# Patient Record
Sex: Female | Born: 1990 | Race: White | Hispanic: No | Smoking: Never smoker
Health system: Southern US, Community
[De-identification: ages and names within clinical notes are randomized; demographics above are authoritative.]

## PROBLEM LIST (undated history)

## (undated) DIAGNOSIS — D649 Anemia, unspecified: Secondary | ICD-10-CM

## (undated) DIAGNOSIS — Z8759 Personal history of other complications of pregnancy, childbirth and the puerperium: Secondary | ICD-10-CM

## (undated) HISTORY — PX: DILATION AND CURETTAGE OF UTERUS: SHX78

---
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
## 2012-09-06 ENCOUNTER — Other Ambulatory Visit: Payer: Self-pay | Admitting: Family Medicine

## 2012-09-06 DIAGNOSIS — N920 Excessive and frequent menstruation with regular cycle: Secondary | ICD-10-CM

## 2012-09-06 DIAGNOSIS — N898 Other specified noninflammatory disorders of vagina: Secondary | ICD-10-CM

## 2012-09-07 ENCOUNTER — Other Ambulatory Visit: Payer: Self-pay

## 2013-03-26 ENCOUNTER — Encounter (HOSPITAL_COMMUNITY): Payer: Self-pay | Admitting: *Deleted

## 2013-03-26 ENCOUNTER — Emergency Department (HOSPITAL_COMMUNITY): Payer: Self-pay

## 2013-03-26 ENCOUNTER — Emergency Department (HOSPITAL_COMMUNITY)
Admission: EM | Admit: 2013-03-26 | Discharge: 2013-03-27 | Disposition: A | Discharge status: Home / Self Care | Payer: Self-pay | Attending: Emergency Medicine | Admitting: Emergency Medicine

## 2013-03-26 ENCOUNTER — Emergency Department (HOSPITAL_COMMUNITY): Admission: EM | Admit: 2013-03-26 | Discharge: 2013-03-26 | Discharge status: Against Medical Advice | Payer: Self-pay

## 2013-03-26 DIAGNOSIS — O9989 Other specified diseases and conditions complicating pregnancy, childbirth and the puerperium: Secondary | ICD-10-CM | POA: Insufficient documentation

## 2013-03-26 DIAGNOSIS — Z3490 Encounter for supervision of normal pregnancy, unspecified, unspecified trimester: Secondary | ICD-10-CM

## 2013-03-26 DIAGNOSIS — O209 Hemorrhage in early pregnancy, unspecified: Secondary | ICD-10-CM | POA: Insufficient documentation

## 2013-03-26 DIAGNOSIS — R110 Nausea: Secondary | ICD-10-CM | POA: Insufficient documentation

## 2013-03-26 DIAGNOSIS — R630 Anorexia: Secondary | ICD-10-CM | POA: Insufficient documentation

## 2013-03-26 DIAGNOSIS — M549 Dorsalgia, unspecified: Secondary | ICD-10-CM | POA: Insufficient documentation

## 2013-03-26 DIAGNOSIS — M899 Disorder of bone, unspecified: Secondary | ICD-10-CM | POA: Insufficient documentation

## 2013-03-26 DIAGNOSIS — Z79899 Other long term (current) drug therapy: Secondary | ICD-10-CM | POA: Insufficient documentation

## 2013-03-26 DIAGNOSIS — R109 Unspecified abdominal pain: Secondary | ICD-10-CM | POA: Insufficient documentation

## 2013-03-26 DIAGNOSIS — M259 Joint disorder, unspecified: Secondary | ICD-10-CM | POA: Insufficient documentation

## 2013-03-26 LAB — POCT I-STAT, CHEM 8
Calcium, Ion: 1.23 mmol/L (ref 1.12–1.23)
HCT: 35 % — ABNORMAL LOW (ref 36.0–46.0)
Hemoglobin: 11.9 g/dL — ABNORMAL LOW (ref 12.0–15.0)
Sodium: 138 mEq/L (ref 135–145)
TCO2: 27 mmol/L (ref 0–100)

## 2013-03-26 LAB — URINE MICROSCOPIC-ADD ON

## 2013-03-26 LAB — URINALYSIS, ROUTINE W REFLEX MICROSCOPIC
Bilirubin Urine: NEGATIVE
Glucose, UA: NEGATIVE mg/dL
Ketones, ur: NEGATIVE mg/dL
Leukocytes, UA: NEGATIVE
Protein, ur: NEGATIVE mg/dL

## 2013-03-26 LAB — HCG, QUANTITATIVE, PREGNANCY: hCG, Beta Chain, Quant, S: 57377 m[IU]/mL — ABNORMAL HIGH (ref <5)

## 2013-03-26 MED ORDER — AZITHROMYCIN 1 G PO PACK
1.0000 g | PACK | Freq: Once | ORAL | Status: AC
  Administered 2013-03-26: 1 g via ORAL
  Filled 2013-03-26: qty 1

## 2013-03-26 MED ORDER — ONDANSETRON 4 MG PO TBDP
4.0000 mg | ORAL_TABLET | Freq: Once | ORAL | Status: AC
  Administered 2013-03-26: 4 mg via ORAL
  Filled 2013-03-26: qty 1

## 2013-03-26 MED ORDER — CEFTRIAXONE SODIUM 250 MG IJ SOLR
250.0000 mg | Freq: Once | INTRAMUSCULAR | Status: AC
  Administered 2013-03-26: 250 mg via INTRAMUSCULAR
  Filled 2013-03-26: qty 250

## 2013-03-26 NOTE — ED Notes (Signed)
PA at bedside.
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
## 2013-03-26 NOTE — ED Notes (Addendum)
Pt in s/p D and C on 3/15, states she has noted constant nausea since that time, also intermittent vaginal bleeding which they told her to expect for up to two weeks, pt states she has been unable to eat like normal due to nausea

## 2013-03-27 LAB — WET PREP, GENITAL
Trich, Wet Prep: NONE SEEN
Yeast Wet Prep HPF POC: NONE SEEN

## 2013-03-27 MED ORDER — ONDANSETRON HCL 4 MG PO TABS: 4.0000 mg | ORAL_TABLET | Freq: Four times a day (QID) | ORAL | Status: DC
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
## 2013-03-27 NOTE — ED Notes (Signed)
MD at bedside to discuss plan of care

## 2014-07-22 ENCOUNTER — Emergency Department (HOSPITAL_COMMUNITY)
Admission: EM | Admit: 2014-07-22 | Discharge: 2014-07-22 | Disposition: A | Discharge status: Home / Self Care | Payer: Self-pay | Attending: Emergency Medicine | Admitting: Emergency Medicine

## 2014-07-22 ENCOUNTER — Encounter (HOSPITAL_COMMUNITY): Payer: Self-pay | Admitting: Emergency Medicine

## 2014-07-22 ENCOUNTER — Emergency Department (HOSPITAL_COMMUNITY): Admission: EM | Admit: 2014-07-22 | Discharge: 2014-07-22 | Discharge status: Against Medical Advice | Payer: Self-pay

## 2014-07-22 DIAGNOSIS — Z3202 Encounter for pregnancy test, result negative: Secondary | ICD-10-CM | POA: Insufficient documentation

## 2014-07-22 DIAGNOSIS — J45909 Unspecified asthma, uncomplicated: Secondary | ICD-10-CM | POA: Insufficient documentation

## 2014-07-22 DIAGNOSIS — Z79899 Other long term (current) drug therapy: Secondary | ICD-10-CM | POA: Insufficient documentation

## 2014-07-22 DIAGNOSIS — R040 Epistaxis: Secondary | ICD-10-CM | POA: Insufficient documentation

## 2014-07-22 DIAGNOSIS — J029 Acute pharyngitis, unspecified: Secondary | ICD-10-CM | POA: Insufficient documentation

## 2014-07-22 DIAGNOSIS — F17200 Nicotine dependence, unspecified, uncomplicated: Secondary | ICD-10-CM | POA: Insufficient documentation

## 2014-07-22 LAB — POC URINE PREG, ED: Preg Test, Ur: NEGATIVE

## 2014-07-22 LAB — RAPID STREP SCREEN (MED CTR MEBANE ONLY): STREPTOCOCCUS, GROUP A SCREEN (DIRECT): NEGATIVE

## 2014-07-22 NOTE — ED Notes (Signed)
Pt arrived to the ED with a complaint of a nose bleed and a sore throat.  Pt states she has been battling a sore throat for six months.  Pt tonight had a nose bleed and then started to spit up blood.

## 2014-07-22 NOTE — Discharge Instructions (Signed)
Gargle warm salt water especially after meals. Followup closely with a local primary Dr. and ENT or worsening symptoms. You may have [place] Diverticulum. If you were given medicines take as directed.  If you are on coumadin or contraceptives realize their levels and effectiveness is altered by many different medicines.  If you have any reaction (rash, tongues swelling, other) to the medicines stop taking and see a physician.   Please follow up as directed and return to the ER or see a physician for new or worsening symptoms.  Thank you. Filed Vitals:   07/22/14 0108  BP: 132/86  Pulse: 75  Temp: 98.6 F (37 C)  TempSrc: Oral  Resp: 16  SpO2: 100%

## 2014-07-22 NOTE — ED Provider Notes (Signed)
CSN: [id number]     Arrival date & time 07/22/14  0058 History   First MD Initiated Contact with Patient 07/22/14 0401     Chief Complaint  Patient presents with  . Sore Throat  . Epistaxis     (Consider location/radiation/quality/duration/timing/severity/associated sxs/prior Treatment) HPI Comments: 23 year old female no significant medical history except asthma, mild smoker presents with recurrent sore throat for 6 months. Patient denies fevers, chills, weight loss, cancer history. Patient had mild nosebleed discussed risk of blood however that is resolved. Patient is on blood thinners and no injuries.  Patient is a 23 y.o. female presenting with pharyngitis and nosebleeds. The history is provided by the patient.  Sore Throat Pertinent negatives include no abdominal pain and no headaches.  Epistaxis Associated symptoms: sore throat   Associated symptoms: no congestion, no fever and no headaches     History reviewed. No pertinent past medical history. Past Surgical History  Procedure Laterality Date  . Dilation and curettage of uterus     History reviewed. No pertinent family history. History  Substance Use Topics  . Smoking status: Light Tobacco Smoker  . Smokeless tobacco: Not on file  . Alcohol Use: Yes   OB History   Grav Para Term Preterm Abortions TAB SAB Ect Mult Living                 Review of Systems  Constitutional: Negative for fever and chills.  HENT: Positive for nosebleeds and sore throat. Negative for congestion.   Gastrointestinal: Negative for vomiting and abdominal pain.  Musculoskeletal: Negative for back pain, neck pain and neck stiffness.  Skin: Negative for rash.  Neurological: Negative for headaches.      Allergies  Review of patient's allergies indicates no known allergies.  Home Medications   Prior to Admission medications   Medication Sig Start Date End Date Taking? Authorizing Provider  OVER THE COUNTER MEDICATION Take 1 each by  mouth daily.   Yes Historical Provider, MD   BP 132/86  Pulse 75  Temp(Src) 98.6 F (37 C) (Oral)  Resp 16  SpO2 100%  LMP 06/25/2014 Physical Exam  Nursing note and vitals reviewed. Constitutional: She appears well-developed and well-nourished. No distress.  HENT:  Head: Normocephalic.  Dried blood mild left nare septum anterior, no hematoma or active bleeding. No trismus, uvular deviation, unilateral posterior pharyngeal edema or submandibular swelling. Mild anterior cervical lymphadenopathy supple neck   Eyes: Conjunctivae are normal.  Neck: Normal range of motion. Neck supple.  Cardiovascular: Normal rate and regular rhythm.     ED Course  Procedures (including critical care time) Labs Review Labs Reviewed  RAPID STREP SCREEN  CULTURE, GROUP A STREP  POC URINE PREG, ED    Imaging Review No results found.   EKG Interpretation None      MDM   Final diagnoses:  Sore throat  Epistaxis  Patient with recurrent sore throat and mild epistaxis. Patient held his side and occasional smoking history.  Patient does complain of recurrent bad breath and feeling like food is caught in the back of her throat. Discussed other differential including diverticulum as possible diagnosis and close outpatient followup with her primary Dr. and ENT. No sign of significant ENT infection in ER. No epistaxis in ED>  Results and differential diagnosis were discussed with the patient/parent/guardian. Close follow up outpatient was discussed, comfortable with the plan.   Medications - No data to display  Filed Vitals:   07/22/14 0108  BP: 132/86  Pulse:  75  Temp: 98.6 F (37 C)  TempSrc: Oral  Resp: 16  SpO2: 100%         Enid Skeens, MD 07/22/14 [phone]

## 2014-07-24 LAB — CULTURE, GROUP A STREP

## 2015-12-26 ENCOUNTER — Other Ambulatory Visit: Payer: Self-pay | Admitting: Obstetrics

## 2015-12-26 DIAGNOSIS — R102 Pelvic and perineal pain: Secondary | ICD-10-CM

## 2016-01-01 ENCOUNTER — Other Ambulatory Visit: Payer: Self-pay

## 2016-01-07 ENCOUNTER — Ambulatory Visit
Admission: RE | Admit: 2016-01-07 | Discharge: 2016-01-07 | Disposition: A | Discharge status: Home / Self Care | Payer: PRIVATE HEALTH INSURANCE | Source: Ambulatory Visit | Attending: Obstetrics | Admitting: Obstetrics

## 2016-01-07 DIAGNOSIS — R102 Pelvic and perineal pain: Secondary | ICD-10-CM

## 2016-01-08 ENCOUNTER — Other Ambulatory Visit: Payer: Self-pay

## 2016-04-25 IMAGING — US US PELVIS COMPLETE
1 series · 13 of 25 positions shown · non-contrast
Comparison: OB ultrasound dated March 28, 2013

CLINICAL DATA: Intermittent episodes of abdominal and pelvic
discomfort. Onset of the last normal menstrual period was December 24, 2015

EXAM:
TRANSABDOMINAL AND TRANSVAGINAL ULTRASOUND OF PELVIS
TECHNIQUE: Both transabdominal and transvaginal ultrasound examinations of the
pelvis were performed. Transabdominal technique was performed for
global imaging of the pelvis including uterus, ovaries, adnexal
regions, and pelvic cul-de-sac. It was necessary to proceed with
endovaginal exam following the transabdominal exam to visualize the
endometrium and ovaries.

[Series 1: us pelvis complete · 0.27mm/px · 13 of 73 slices shown]
[im 1/73]
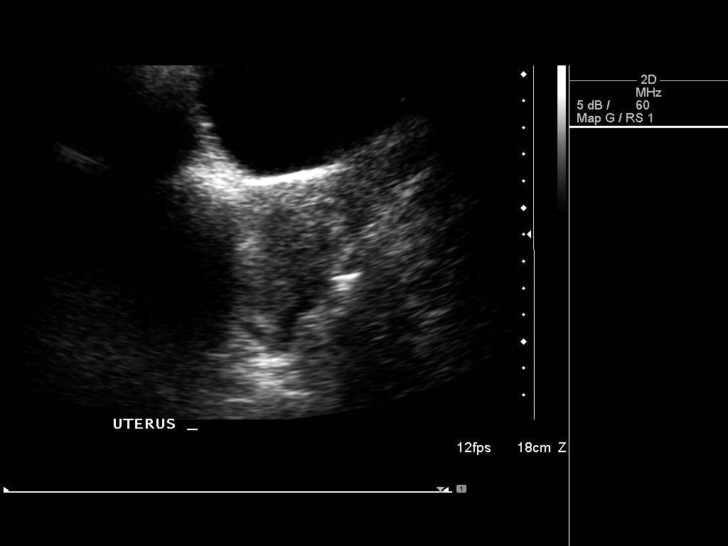
[im 7/73]
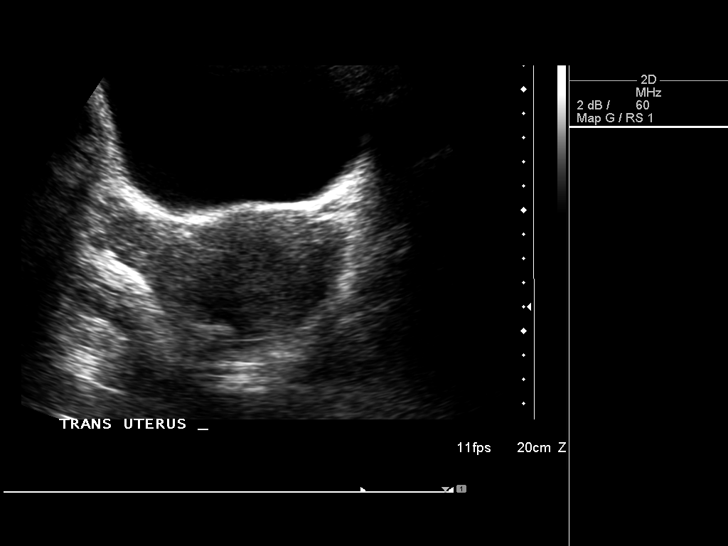
[im 13/73]
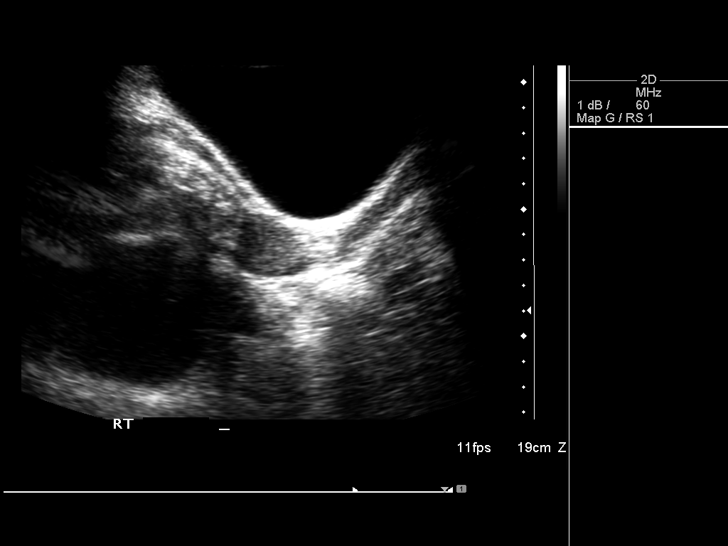
[im 19/73]
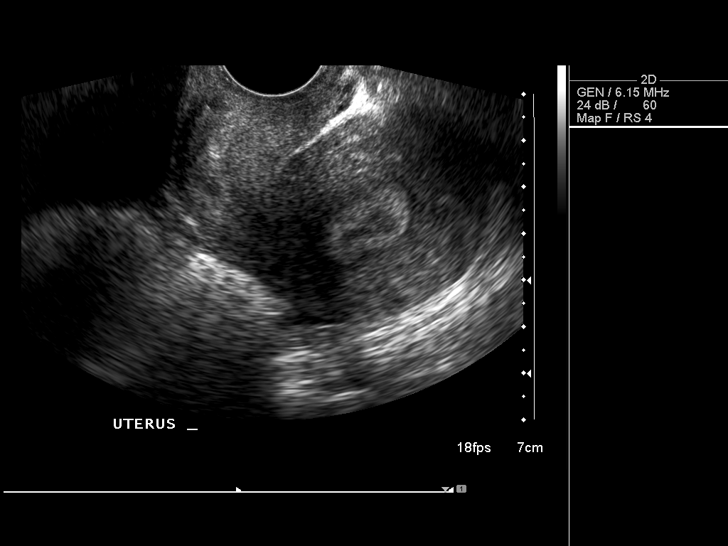
[im 25/73]
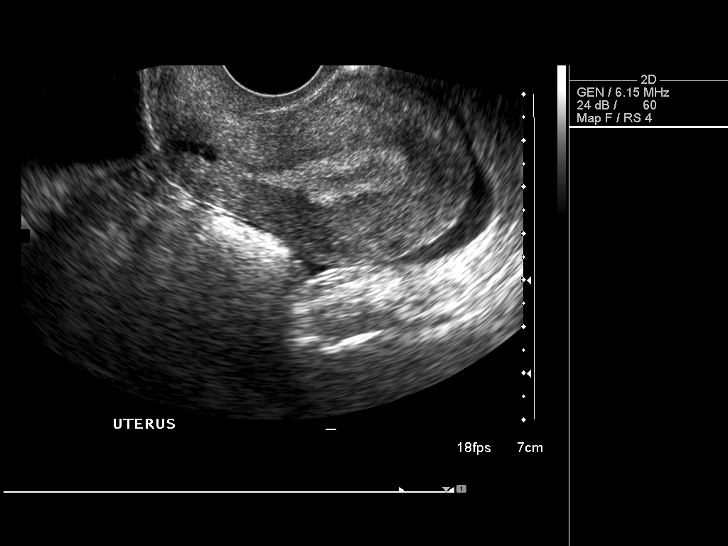
[im 31/73]
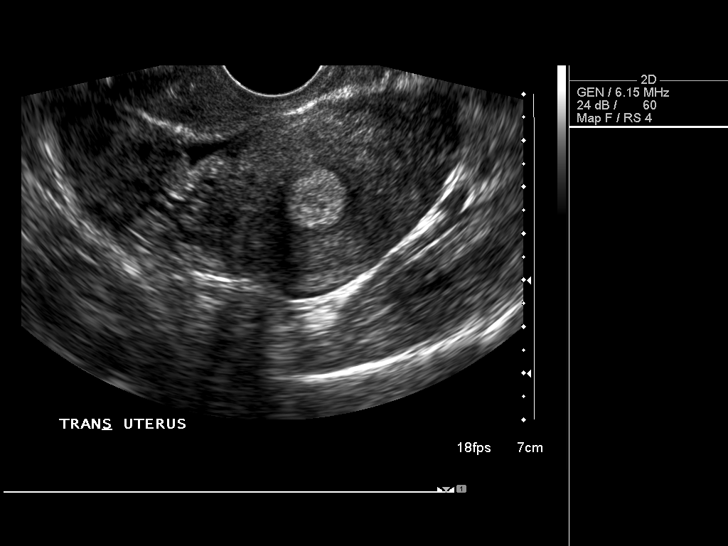
[im 37/73]
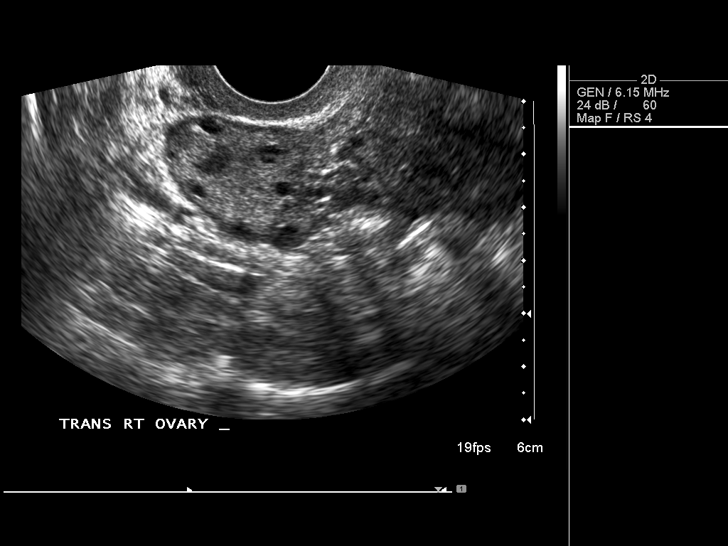
[im 43/73]
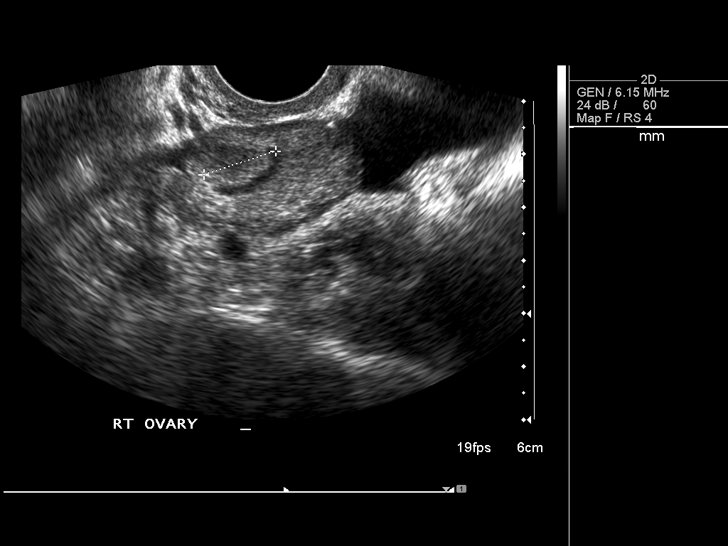
[im 49/73]
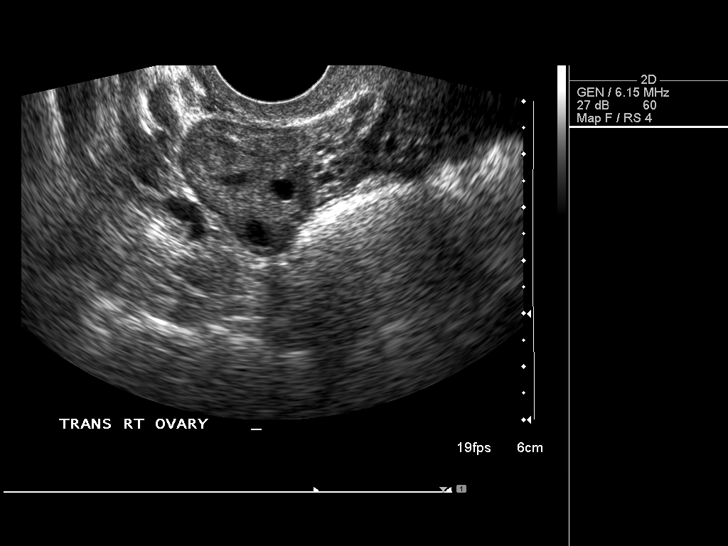
[im 55/73]
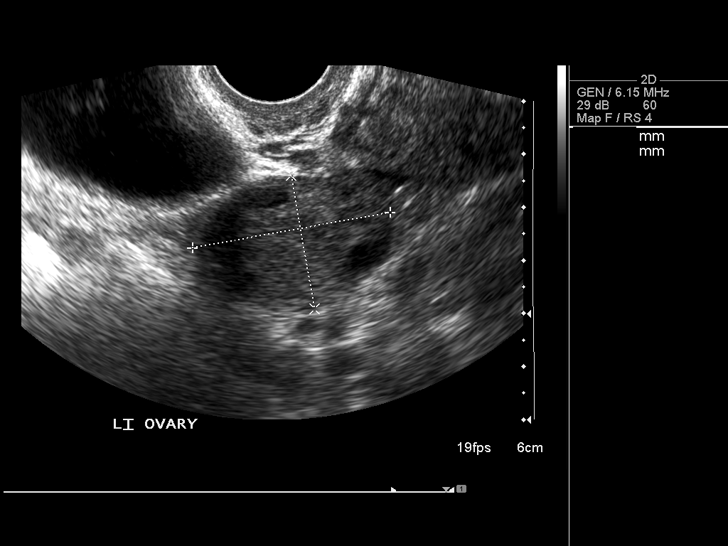
[im 61/73]
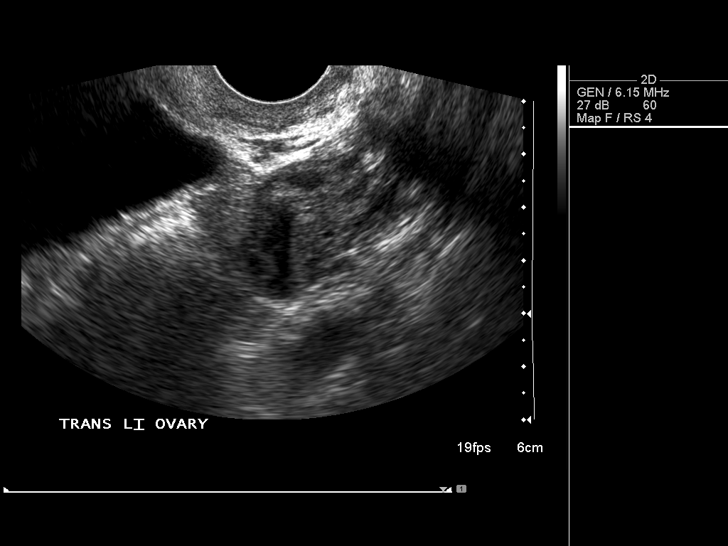
[im 67/73]
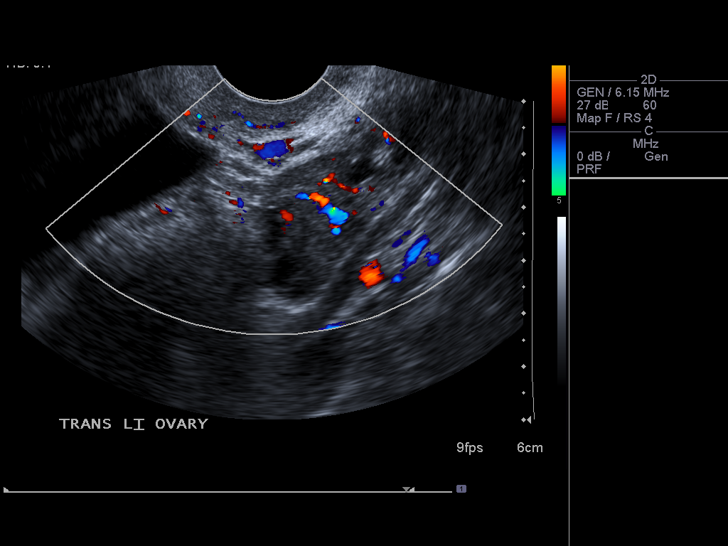
[im 73/73]
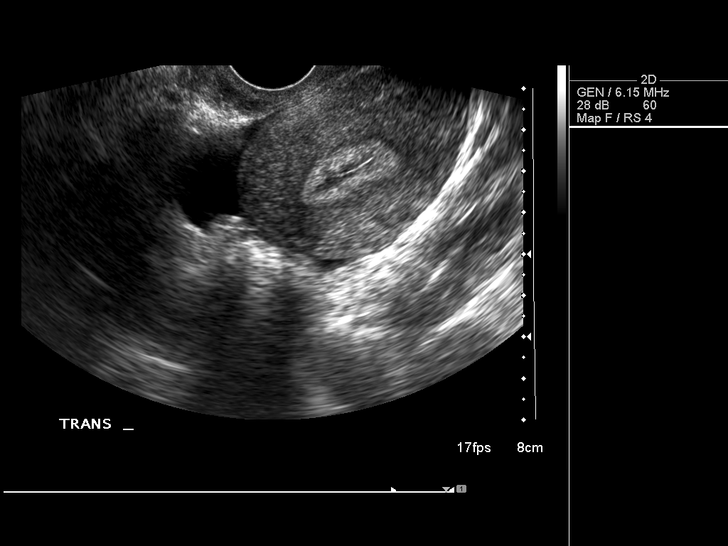

[13 of 25 positions shown; findings below may reference images not displayed]

FINDINGS: Uterus

Measurements: 8.7 x 4.3 x 7.2 cm.. The uterine echotexture is
normal. There are a few nabothian cysts.

Endometrium

Thickness: 9.8 mm.  No focal abnormality visualized.

Right ovary

Measurements: 3.6 x 2.4 x 2.7 cm. There are developing follicles. A
dominant 1.4 cm isoechoic top hypoechoic mixed echogenicity
structure may reflect a collapsing cyst.

Left ovary

Measurements: 3.8 x 2.6 x 1.9 cm. There are developing follicles in
the left ovary. There is a mixed echogenicity structure measuring up
to 2 cm in diameter similar to that on the right which likely
reflects a collapsing cyst.

Other findings

There is small amount of free pelvic fluid
IMPRESSION: 1. Normal appearance of the uterus and endometrium. A few nabothian
cysts are present.
2. Probable collapsing cysts bilaterally. The largest of these is on
the left and measures 2 cm in diameter.
3. Small amount of free pelvic fluid.
4. If the patient's intermittent abdominal and pelvic symptoms
persists, further evaluation with abdominal and pelvic CT scanning
may be useful to evaluate the bowel and other abdominal viscera.

## 2017-01-09 ENCOUNTER — Other Ambulatory Visit: Payer: Self-pay | Admitting: Family

## 2017-02-26 ENCOUNTER — Inpatient Hospital Stay (HOSPITAL_COMMUNITY)
Admission: AD | Admit: 2017-02-26 | Payer: PRIVATE HEALTH INSURANCE | Source: Ambulatory Visit | Admitting: Obstetrics and Gynecology

## 2018-08-28 ENCOUNTER — Emergency Department (HOSPITAL_COMMUNITY): Payer: Medicaid Other

## 2018-08-28 ENCOUNTER — Other Ambulatory Visit: Payer: Self-pay

## 2018-08-28 ENCOUNTER — Encounter (HOSPITAL_COMMUNITY): Payer: Self-pay | Admitting: Emergency Medicine

## 2018-08-28 ENCOUNTER — Emergency Department (HOSPITAL_COMMUNITY)
Admission: EM | Admit: 2018-08-28 | Discharge: 2018-08-28 | Disposition: A | Discharge status: Home / Self Care | Payer: Medicaid Other | Attending: Emergency Medicine | Admitting: Emergency Medicine

## 2018-08-28 DIAGNOSIS — Z79899 Other long term (current) drug therapy: Secondary | ICD-10-CM | POA: Diagnosis not present

## 2018-08-28 DIAGNOSIS — F17210 Nicotine dependence, cigarettes, uncomplicated: Secondary | ICD-10-CM | POA: Insufficient documentation

## 2018-08-28 DIAGNOSIS — N939 Abnormal uterine and vaginal bleeding, unspecified: Secondary | ICD-10-CM

## 2018-08-28 DIAGNOSIS — Z98891 History of uterine scar from previous surgery: Secondary | ICD-10-CM

## 2018-08-28 LAB — BASIC METABOLIC PANEL
ANION GAP: 12 (ref 5–15)
BUN: 13 mg/dL (ref 6–20)
CHLORIDE: 104 mmol/L (ref 98–111)
CO2: 23 mmol/L (ref 22–32)
Calcium: 8.5 mg/dL — ABNORMAL LOW (ref 8.9–10.3)
Creatinine, Ser: 0.72 mg/dL (ref 0.44–1.00)
GFR calc non Af Amer: >60 mL/min (ref >60)
Glucose, Bld: 86 mg/dL (ref 70–99)
POTASSIUM: 4.1 mmol/L (ref 3.5–5.1)
Sodium: 139 mmol/L (ref 135–145)

## 2018-08-28 LAB — CBC WITH DIFFERENTIAL/PLATELET
Abs Immature Granulocytes: 0.1 10*3/uL (ref 0.0–0.1)
Basophils Absolute: 0 10*3/uL (ref 0.0–0.1)
Basophils Relative: 0 %
Eosinophils Absolute: 0.2 10*3/uL (ref 0.0–0.7)
Eosinophils Relative: 2 %
HEMATOCRIT: 28.4 % — AB (ref 36.0–46.0)
Hemoglobin: 8.8 g/dL — ABNORMAL LOW (ref 12.0–15.0)
IMMATURE GRANULOCYTES: 1 %
LYMPHS ABS: 2.2 10*3/uL (ref 0.7–4.0)
LYMPHS PCT: 19 %
MCH: 29.6 pg (ref 26.0–34.0)
MCHC: 31 g/dL (ref 30.0–36.0)
MCV: 95.6 fL (ref 78.0–100.0)
MONOS PCT: 6 %
Monocytes Absolute: 0.7 10*3/uL (ref 0.1–1.0)
NEUTROS ABS: 8.3 10*3/uL — AB (ref 1.7–7.7)
NEUTROS PCT: 72 %
Platelets: 303 10*3/uL (ref 150–400)
RBC: 2.97 MIL/uL — AB (ref 3.87–5.11)
RDW: 13.7 % (ref 11.5–15.5)
WBC: 11.6 10*3/uL — AB (ref 4.0–10.5)

## 2018-08-28 LAB — ABO/RH: ABO/RH(D): O POS

## 2018-08-28 MED ORDER — GENERIC EXTERNAL MEDICATION
4.00 | Status: DC
Start: ? — End: 2018-08-28

## 2018-08-28 MED ORDER — LANOLIN EX OINT
TOPICAL_OINTMENT | CUTANEOUS | Status: DC
Start: ? — End: 2018-08-28

## 2018-08-28 MED ORDER — ACETAMINOPHEN 325 MG PO TABS
650.00 | ORAL_TABLET | ORAL | Status: DC
Start: ? — End: 2018-08-28

## 2018-08-28 MED ORDER — IBUPROFEN 800 MG PO TABS
800.00 | ORAL_TABLET | ORAL | Status: DC
Start: 2018-08-26 — End: 2018-08-28

## 2018-08-28 MED ORDER — GENERIC EXTERNAL MEDICATION
12.50 | Status: DC
Start: ? — End: 2018-08-28

## 2018-08-28 MED ORDER — GUAIFENESIN 100 MG/5ML PO SYRP
200.00 | ORAL_SOLUTION | ORAL | Status: DC
Start: ? — End: 2018-08-28

## 2018-08-28 MED ORDER — HYDROMORPHONE HCL 1 MG/ML IJ SOLN
1.00 | INTRAMUSCULAR | Status: DC
Start: ? — End: 2018-08-28

## 2018-08-28 MED ORDER — DOCUSATE SODIUM 100 MG PO CAPS
100.00 | ORAL_CAPSULE | ORAL | Status: DC
Start: 2018-08-26 — End: 2018-08-28

## 2018-08-28 MED ORDER — GENERIC EXTERNAL MEDICATION
Status: DC
Start: ? — End: 2018-08-28

## 2018-08-28 MED ORDER — FERROUS SULFATE 325 (65 FE) MG PO TABS
325.00 | ORAL_TABLET | ORAL | Status: DC
Start: 2018-08-27 — End: 2018-08-28

## 2018-08-28 MED ORDER — GENERIC EXTERNAL MEDICATION
25.00 | Status: DC
Start: ? — End: 2018-08-28

## 2018-08-28 MED ORDER — BENZOCAINE-MENTHOL 20-0.5 % EX AERO
INHALATION_SPRAY | CUTANEOUS | Status: DC
Start: ? — End: 2018-08-28

## 2018-08-28 MED ORDER — MORPHINE SULFATE (PF) 4 MG/ML IV SOLN
4.00 | INTRAVENOUS | Status: DC
Start: ? — End: 2018-08-28

## 2018-08-28 MED ORDER — HYDROCODONE-ACETAMINOPHEN 5-325 MG PO TABS
1.00 | ORAL_TABLET | ORAL | Status: DC
Start: ? — End: 2018-08-28

## 2018-08-28 MED ORDER — SIMETHICONE 80 MG PO CHEW
80.00 | CHEWABLE_TABLET | ORAL | Status: DC
Start: ? — End: 2018-08-28

## 2018-08-28 MED ORDER — MAGNESIUM HYDROXIDE 400 MG/5ML PO SUSP
30.00 | ORAL | Status: DC
Start: ? — End: 2018-08-28

## 2018-08-28 MED ORDER — HYDROCODONE-ACETAMINOPHEN 10-325 MG PO TABS
1.00 | ORAL_TABLET | ORAL | Status: DC
Start: ? — End: 2018-08-28

## 2018-08-28 MED ORDER — SALINE NASAL SPRAY 0.65 % NA SOLN
2.00 | NASAL | Status: DC
Start: ? — End: 2018-08-28

## 2018-08-28 MED ORDER — BISACODYL 10 MG RE SUPP
10.00 | RECTAL | Status: DC
Start: ? — End: 2018-08-28

## 2018-08-28 MED ORDER — LACTATED RINGERS IV SOLN
125.00 | INTRAVENOUS | Status: DC
Start: ? — End: 2018-08-28

## 2018-08-28 MED ORDER — BENZOCAINE-MENTHOL 15-3.6 MG MT LOZG
1.00 | LOZENGE | OROMUCOSAL | Status: DC
Start: ? — End: 2018-08-28

## 2018-08-28 MED ORDER — MORPHINE SULFATE (PF) 10 MG/ML IV SOLN
10.00 | INTRAVENOUS | Status: DC
Start: ? — End: 2018-08-28

## 2018-08-28 MED ORDER — PRENATAL 19 PO TABS
1.00 | ORAL_TABLET | ORAL | Status: DC
Start: 2018-08-27 — End: 2018-08-28

## 2018-08-28 MED ORDER — OXYCODONE HCL 10 MG PO TABS
10.00 | ORAL_TABLET | ORAL | Status: DC
Start: ? — End: 2018-08-28

## 2018-08-28 NOTE — ED Triage Notes (Signed)
Pt reports passing 3 large blood clots followed by heavy vaginal bleeding since 1:25am.  Pt had c-section on Tuesday.  Reports lower abd pressure and swelling.  History of anemia.

## 2018-08-28 NOTE — Discharge Instructions (Addendum)
Follow-up with your OB/GYN in the next few days.  Return to the emergency department if bleeding significantly worsens.

## 2018-08-28 NOTE — ED Provider Notes (Signed)
[facility] Note   CSN: [id number] Arrival date & time: 08/28/18  0148     History   Chief Complaint Chief Complaint  Patient presents with  . Vaginal Bleeding    HPI Nancy Mendoza is a 27 y.o. female.  Patient is a 27 year old female who is 3 days postpartum from C-section.  She presents with complaints of bleeding.  She stated that this evening she passed several clots from her vagina followed by blood.  She soiled several pads, then presents here for evaluation.  She denies to me that she is having any lightheadedness or dizziness.  She does report abdominal soreness, however no significant discomfort.  The history is provided by the patient.  Vaginal Bleeding  Primary symptoms include vaginal bleeding.  Primary symptoms include no discharge. There has been no fever. This is a new problem. The current episode started 3 to 5 hours ago. The problem occurs constantly. The problem has been gradually improving.    History reviewed. No pertinent past medical history.  There are no active problems to display for this patient.   Past Surgical History:  Procedure Laterality Date  . CESAREAN SECTION    . DILATION AND CURETTAGE OF UTERUS       OB History   None      Home Medications    Prior to Admission medications   Medication Sig Start Date End Date Taking? Authorizing Provider  OVER THE COUNTER MEDICATION Take 1 each by mouth daily.    [provider]    Family History No family history on file.  Social History Social History   Tobacco Use  . Smoking status: Light Tobacco Smoker  . Smokeless tobacco: Never Used  Substance Use Topics  . Alcohol use: Yes  . Drug use: No     Allergies   Patient has no known allergies.   Review of Systems Review of Systems  Genitourinary: Positive for vaginal bleeding.  All other systems reviewed and are negative.    Physical Exam Updated Vital Signs BP  (!) 127/92 (BP Location: Right Arm)   Pulse (!) 101   Temp 98.2 F (36.8 C) (Oral)   Resp 18   SpO2 100%   Physical Exam  Constitutional: She is oriented to person, place, and time. She appears well-developed and well-nourished. No distress.  HENT:  Head: Normocephalic and atraumatic.  Neck: Normal range of motion. Neck supple.  Cardiovascular: Normal rate and regular rhythm. Exam reveals no gallop and no friction rub.  No murmur heard. Pulmonary/Chest: Effort normal and breath sounds normal. No respiratory distress. She has no wheezes.  Abdominal: Soft. Bowel sounds are normal. She exhibits no distension. There is no tenderness.  Genitourinary:  Genitourinary Comments: There is blood in the vaginal vault with oozing from the cervical os.  Musculoskeletal: Normal range of motion.  Neurological: She is alert and oriented to person, place, and time.  Skin: Skin is warm and dry. She is not diaphoretic.  Nursing note and vitals reviewed.    ED Treatments / Results  Labs (all labs ordered are listed, but only abnormal results are displayed) Labs Reviewed  CBC WITH DIFFERENTIAL/PLATELET  BASIC METABOLIC PANEL  TYPE AND SCREEN    EKG None  Radiology No results found.  Procedures Procedures (including critical care time)  Medications Ordered in ED Medications - No data to display   Initial Impression / Assessment and Plan / ED Course  I have reviewed the triage  vital signs and the nursing notes.  Pertinent labs & imaging results that were available during my care of the patient were reviewed by me and considered in my medical decision making (see chart for details).  Patient presents with vaginal bleeding 3 days status post C-section.  She reports passing several clots along with a trickle of blood.  Her vital signs indicate that she is hemodynamically stable.  Her hemoglobin has dropped from 9.2-8.8.  She has been observed in the ER for several hours and has had minimal  additional bleeding.  Ultrasound shows a normal endometrium and no other concerning findings.  I have discussed this patient with Dr. Debroah Loop from OB/GYN who feels as though the patient is appropriate for discharge.  She is to continue taking her iron supplement and follow-up with her OB.  Final Clinical Impressions(s) / ED Diagnoses   Final diagnoses:  None    ED Discharge Orders    None       Geoffery Lyons, MD 08/28/18 [phone]

## 2018-08-29 LAB — TYPE AND SCREEN
ABO/RH(D): O POS
Antibody Screen: NEGATIVE

## 2020-01-07 ENCOUNTER — Ambulatory Visit: Payer: Medicaid Other | Attending: Internal Medicine

## 2020-01-07 DIAGNOSIS — Z23 Encounter for immunization: Secondary | ICD-10-CM | POA: Insufficient documentation

## 2020-01-28 ENCOUNTER — Ambulatory Visit: Payer: Medicaid Other | Attending: Internal Medicine

## 2020-01-28 DIAGNOSIS — Z23 Encounter for immunization: Secondary | ICD-10-CM | POA: Insufficient documentation

## 2020-03-30 IMAGING — US US PELVIS COMPLETE
1 series · 14 of 25 positions shown · non-contrast
Comparison: Pelvic ultrasound dated 01/07/2016

CLINICAL DATA: 26-year-old female status post C-section presenting
with vaginal bleeding.

EXAM:
TRANSABDOMINAL ULTRASOUND OF PELVIS
TECHNIQUE: Transabdominal ultrasound examination of the pelvis was performed
including evaluation of the uterus, ovaries, adnexal regions, and
pelvic cul-de-sac.

[Series 1: us pelvis complete · 0.28mm/px · 33 acquisitions, 14 frames shown]
[im 1/33]
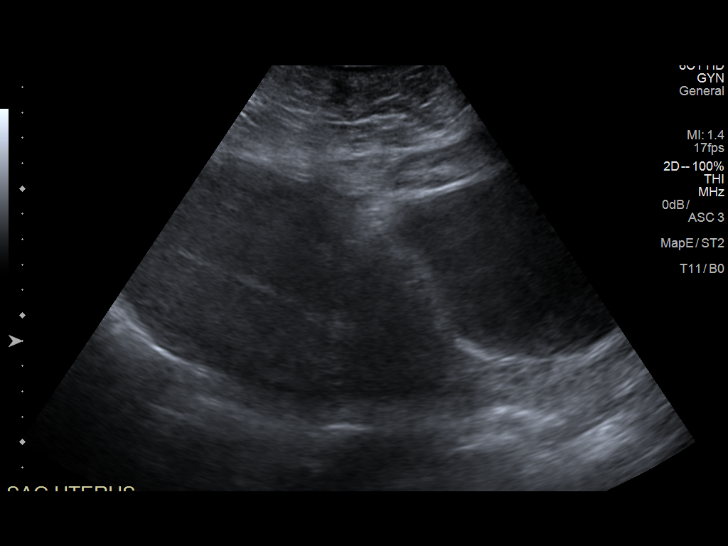
[im 3/33]
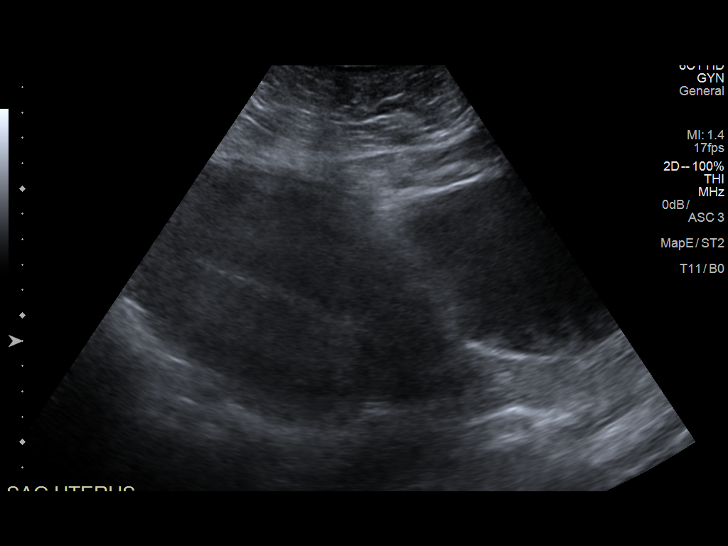
[im 6/33]
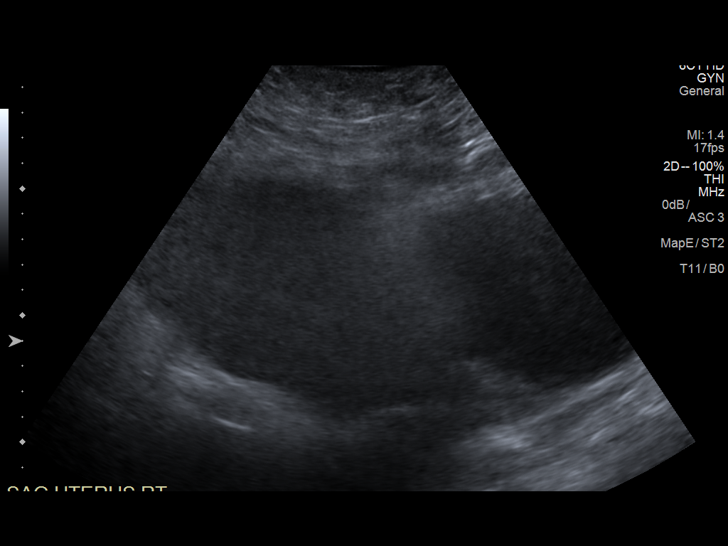
[im 9/33]
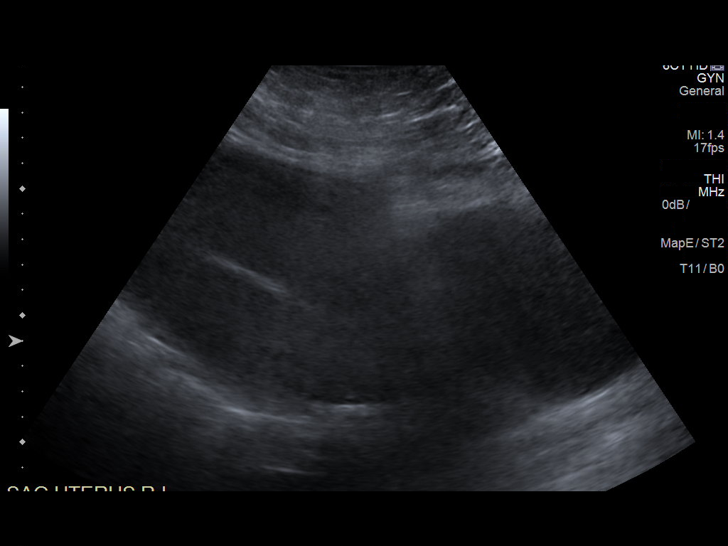
[im 11/33]
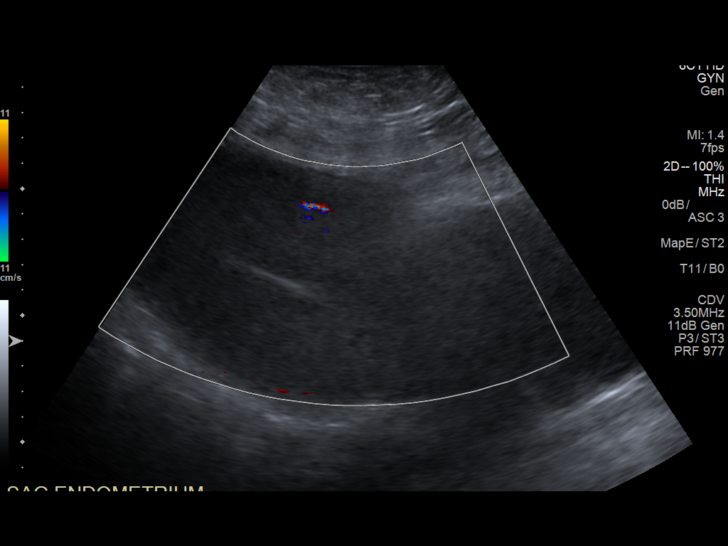
[im 13/33]
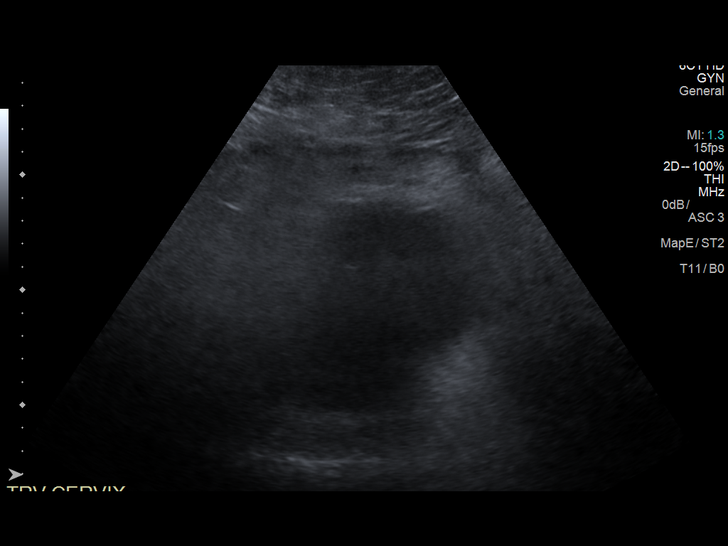
[im 15/33]
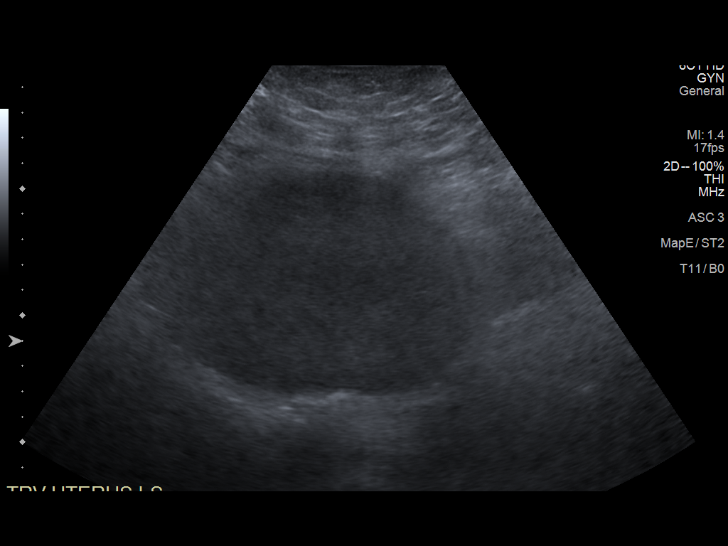
[im 18/33]
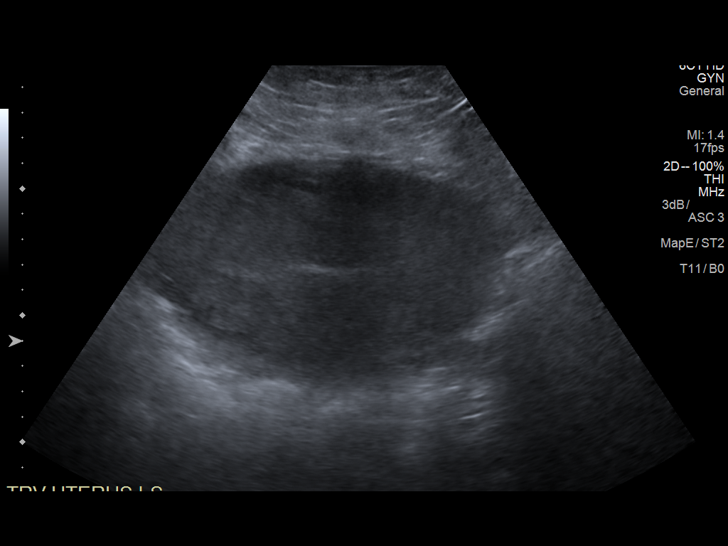
[im 21/33]
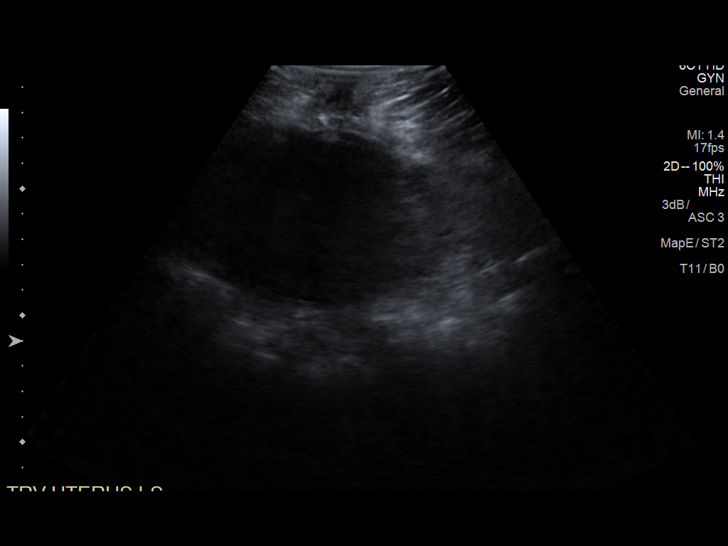
[im 22/33]
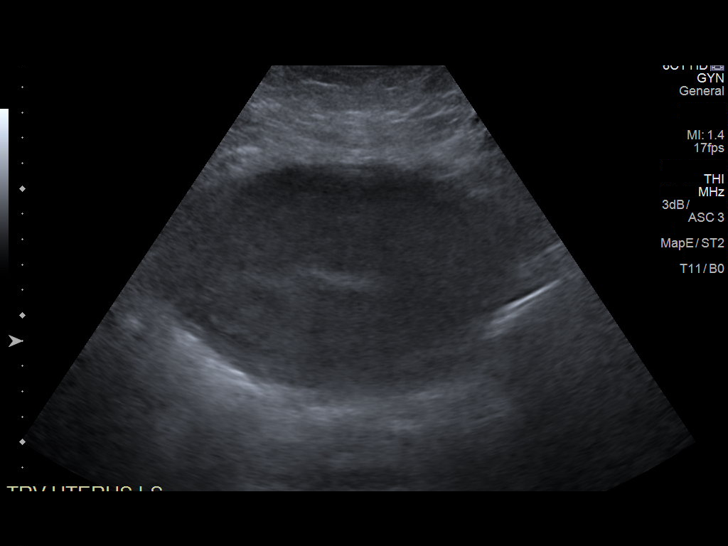
[im 25/33]
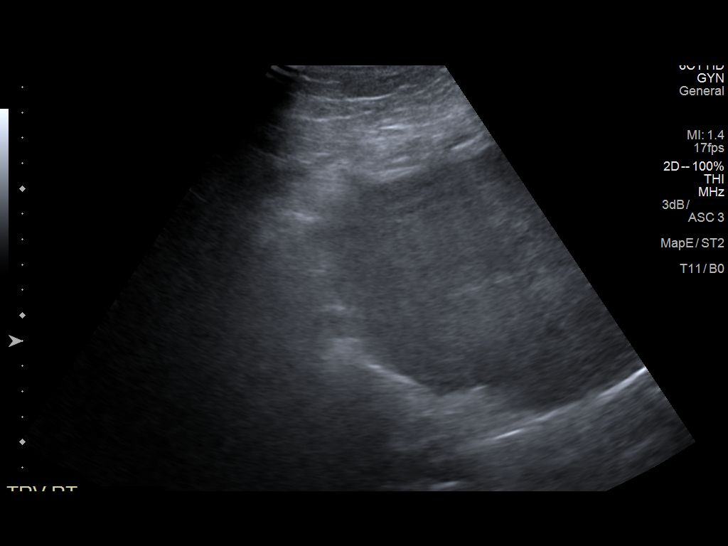
[im 27/33]
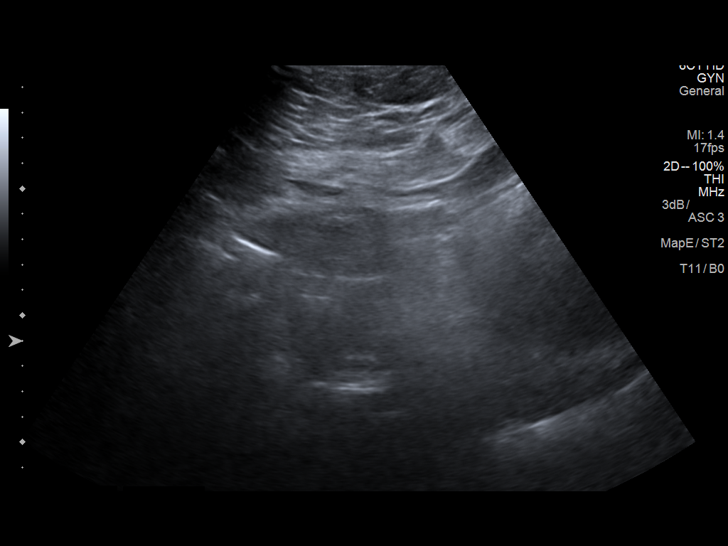
[im 30/33]
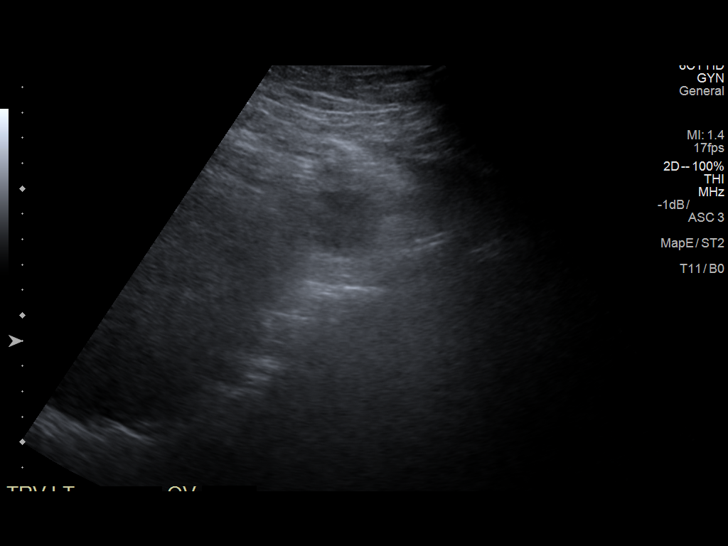
[im 33/33]
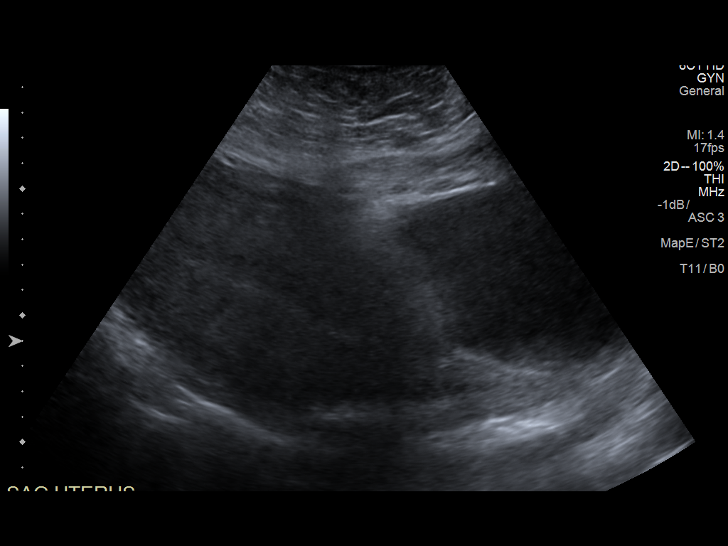

[14 of 25 positions shown; findings below may reference images not displayed]

FINDINGS: Uterus

Measurements: 19.5 x 8.4 x 11.6 cm. No fibroids or other mass
visualized.

Endometrium

Thickness: 4 mm.  No focal abnormality visualized.

Right ovary

Not visualized.

Left ovary

Not visualized.

Other findings:  No abnormal free fluid.
IMPRESSION: 1. Enlarged uterus in keeping with postpartum state.
2. Unremarkable endometrium.
3. Nonvisualization of the ovaries.

## 2020-06-19 DIAGNOSIS — Z3201 Encounter for pregnancy test, result positive: Secondary | ICD-10-CM | POA: Diagnosis not present

## 2020-07-02 ENCOUNTER — Ambulatory Visit (HOSPITAL_COMMUNITY)
Admission: EM | Admit: 2020-07-02 | Discharge: 2020-07-02 | Disposition: A | Discharge status: Home / Self Care | Payer: Medicaid Other

## 2020-07-02 ENCOUNTER — Other Ambulatory Visit: Payer: Self-pay

## 2020-07-02 ENCOUNTER — Encounter (HOSPITAL_COMMUNITY): Payer: Self-pay

## 2020-07-02 DIAGNOSIS — R220 Localized swelling, mass and lump, head: Secondary | ICD-10-CM | POA: Diagnosis not present

## 2020-07-02 DIAGNOSIS — Z3201 Encounter for pregnancy test, result positive: Secondary | ICD-10-CM | POA: Diagnosis not present

## 2020-07-02 DIAGNOSIS — K047 Periapical abscess without sinus: Secondary | ICD-10-CM

## 2020-07-02 LAB — POC URINE PREG, ED: Preg Test, Ur: POSITIVE — AB

## 2020-07-02 MED ORDER — LIDOCAINE HCL (PF) 1 % IJ SOLN
INTRAMUSCULAR | Status: AC
  Filled 2020-07-02: qty 2

## 2020-07-02 MED ORDER — AMOXICILLIN-POT CLAVULANATE 875-125 MG PO TABS: 1.0000 | ORAL_TABLET | Freq: Two times a day (BID) | ORAL | 0 refills | Status: AC

## 2020-07-02 MED ORDER — CEFTRIAXONE SODIUM 500 MG IJ SOLR
500.0000 mg | Freq: Once | INTRAMUSCULAR | Status: AC
  Administered 2020-07-02: 500 mg via INTRAMUSCULAR

## 2020-07-02 MED ORDER — CEFTRIAXONE SODIUM 500 MG IJ SOLR
INTRAMUSCULAR | Status: AC
  Filled 2020-07-02: qty 500

## 2020-07-02 NOTE — ED Triage Notes (Signed)
Pt c/o left facial swelling/eye swelling since yesterday. Pt states she had upper left tooth filled approx 1 week ago.  [facility] and is approx 6 weeks pregnancy.  Denies fever, chills, drainage/foul taste in mouth.

## 2020-07-02 NOTE — Discharge Instructions (Signed)
You received Rocephin as an injection in our office today  I have also sent in Augmentin for you to take twice a day for 7 days  If you are not feeling better, follow-up with primary care or at this office  Follow-up in the ER for

## 2020-07-02 NOTE — ED Provider Notes (Signed)
[facility]   [id number] 07/02/20 Arrival Time: 1232  CC: DENTAL pain  SUBJECTIVE:  Nancy Mendoza is a 29 y.o. female who presents with dental pain 2 days.  Reports that she had a tooth filled on her left upper last week.  Reports that her dentist will not see her and prescribe antibiotics because she is [redacted] weeks pregnant.  Has taken Tylenol at home without relief.  Reports that she is having swelling up to her eye on the left side.  Does see a dentist regularly.  Worse with chewing. Denies fever, chills, dysphagia, odynophagia, oral or neck swelling, nausea, vomiting, chest pain, SOB.    ROS: As per HPI.  All other pertinent ROS negative.     History reviewed. No pertinent past medical history. Past Surgical History:  Procedure Laterality Date  . CESAREAN SECTION    . DILATION AND CURETTAGE OF UTERUS     No Known Allergies No current facility-administered medications on file prior to encounter.   Current Outpatient Medications on File Prior to Encounter  Medication Sig Dispense Refill  . Prenatal Multivit-Min-Fe-FA (PRE-NATAL PO) Take by mouth.    Marland Kitchen OVER THE COUNTER MEDICATION Take 1 each by mouth daily.     Social History   Socioeconomic History  . Marital status: Legally Separated    Spouse name: Not on file  . Number of children: Not on file  . Years of education: Not on file  . Highest education level: Not on file  Occupational History  . Not on file  Tobacco Use  . Smoking status: Light Tobacco Smoker  . Smokeless tobacco: Never Used  Substance and Sexual Activity  . Alcohol use: Yes  . Drug use: No  . Sexual activity: Yes  Other Topics Concern  . Not on file  Social History Narrative  . Not on file   Social Determinants of Health   Financial Resource Strain:   . Difficulty of Paying Living Expenses:   Food Insecurity:   . Worried About Programme researcher, broadcasting/film/video in the Last Year:   . Barista in the Last Year:   Transportation Needs:   .  Freight forwarder (Medical):   Marland Kitchen Lack of Transportation (Non-Medical):   Physical Activity:   . Days of Exercise per Week:   . Minutes of Exercise per Session:   Stress:   . Feeling of Stress :   Social Connections:   . Frequency of Communication with Friends and Family:   . Frequency of Social Gatherings with Friends and Family:   . Attends Religious Services:   . Active Member of Clubs or Organizations:   . Attends Banker Meetings:   Marland Kitchen Marital Status:   Intimate Partner Violence:   . Fear of Current or Ex-Partner:   . Emotionally Abused:   Marland Kitchen Physically Abused:   . Sexually Abused:    Family History  Problem Relation Age of Onset  . COPD Mother   . Healthy Father     OBJECTIVE:  Vitals:   07/02/20 1303  BP: 123/81  Pulse: 88  Resp: 17  Temp: 98.3 F (36.8 C)  TempSrc: Oral  SpO2: 99%    General appearance: alert; no distress HENT: normocephalic; atraumatic; dentition: good; good dentition over left upper gums without areas of fluctuance Neck: supple without LAD Lungs: normal respirations Skin: warm and dry Psychological: alert and cooperative; normal mood and affect  ASSESSMENT & PLAN:  1. Facial swelling  2. Dental infection     Meds ordered this encounter  Medications  . cefTRIAXone (ROCEPHIN) injection 500 mg  . amoxicillin-clavulanate (AUGMENTIN) 875-125 MG tablet    Sig: Take 1 tablet by mouth 2 (two) times daily for 7 days.    Dispense:  14 tablet    Refill:  0    Order Specific Question:   Supervising Provider    Answer:   Merrilee Jansky [id number]    Rocephin in office today Augmentin prescribed Recommend soft diet until evaluated by dentist Maintain oral hygiene care Follow up with dentist as soon as possible for further evaluation and treatment  Return or go to the ED if you have any new or worsening symptoms such as fever, chills, difficulty swallowing, painful swallowing, oral or neck swelling, nausea, vomiting,  chest pain, SOB.  Reviewed expectations re: course of current medical issues. Questions answered. Outlined signs and symptoms indicating need for more acute intervention. Patient verbalized understanding. After Visit Summary given.    Moshe Cipro, NP 07/02/20 1339

## 2020-07-19 DIAGNOSIS — Z3480 Encounter for supervision of other normal pregnancy, unspecified trimester: Secondary | ICD-10-CM | POA: Diagnosis not present

## 2020-08-20 DIAGNOSIS — Z3682 Encounter for antenatal screening for nuchal translucency: Secondary | ICD-10-CM | POA: Diagnosis not present

## 2020-08-20 LAB — OB RESULTS CONSOLE RPR: RPR: NONREACTIVE

## 2020-08-20 LAB — OB RESULTS CONSOLE HEPATITIS B SURFACE ANTIGEN: Hepatitis B Surface Ag: NEGATIVE

## 2020-08-20 LAB — OB RESULTS CONSOLE ANTIBODY SCREEN: Antibody Screen: NEGATIVE

## 2020-08-20 LAB — OB RESULTS CONSOLE RUBELLA ANTIBODY, IGM: Rubella: IMMUNE

## 2020-08-20 LAB — OB RESULTS CONSOLE HIV ANTIBODY (ROUTINE TESTING): HIV: NONREACTIVE

## 2020-08-20 LAB — OB RESULTS CONSOLE GC/CHLAMYDIA
Chlamydia: NEGATIVE
Gonorrhea: NEGATIVE

## 2020-08-20 LAB — OB RESULTS CONSOLE ABO/RH: RH Type: POSITIVE

## 2020-09-14 DIAGNOSIS — Z3482 Encounter for supervision of other normal pregnancy, second trimester: Secondary | ICD-10-CM | POA: Diagnosis not present

## 2020-10-05 DIAGNOSIS — Z363 Encounter for antenatal screening for malformations: Secondary | ICD-10-CM | POA: Diagnosis not present

## 2020-10-05 DIAGNOSIS — Z36 Encounter for antenatal screening for chromosomal anomalies: Secondary | ICD-10-CM | POA: Diagnosis not present

## 2020-11-28 DIAGNOSIS — Z3480 Encounter for supervision of other normal pregnancy, unspecified trimester: Secondary | ICD-10-CM | POA: Diagnosis not present

## 2020-12-14 DIAGNOSIS — Z23 Encounter for immunization: Secondary | ICD-10-CM | POA: Diagnosis not present

## 2020-12-20 ENCOUNTER — Other Ambulatory Visit: Payer: Self-pay | Admitting: Obstetrics and Gynecology

## 2020-12-26 ENCOUNTER — Encounter (HOSPITAL_COMMUNITY): Payer: Self-pay | Admitting: Emergency Medicine

## 2020-12-26 ENCOUNTER — Ambulatory Visit (HOSPITAL_COMMUNITY)
Admission: EM | Admit: 2020-12-26 | Discharge: 2020-12-26 | Disposition: A | Discharge status: Home / Self Care | Payer: Medicaid Other | Attending: Internal Medicine | Admitting: Internal Medicine

## 2020-12-26 DIAGNOSIS — B349 Viral infection, unspecified: Secondary | ICD-10-CM

## 2020-12-26 DIAGNOSIS — R059 Cough, unspecified: Secondary | ICD-10-CM | POA: Insufficient documentation

## 2020-12-26 DIAGNOSIS — U071 COVID-19: Secondary | ICD-10-CM | POA: Insufficient documentation

## 2020-12-26 DIAGNOSIS — Z20822 Contact with and (suspected) exposure to covid-19: Secondary | ICD-10-CM | POA: Diagnosis present

## 2020-12-26 DIAGNOSIS — R52 Pain, unspecified: Secondary | ICD-10-CM | POA: Diagnosis not present

## 2020-12-26 MED ORDER — CETIRIZINE HCL 10 MG PO TABS: 10.0000 mg | ORAL_TABLET | Freq: Every day | ORAL | 0 refills | Status: DC

## 2020-12-26 NOTE — Discharge Instructions (Signed)

## 2020-12-26 NOTE — ED Triage Notes (Signed)
PT C/O: cold sx onset yest associated w/nausea, headaches, bodyaches  Youngest son tested positive for COVID today.   Pt is [redacted] weeks pregnant.   DENIES: fevers  TAKING MEDS: none  A&O x4... NAD... Ambulatory

## 2020-12-26 NOTE — ED Provider Notes (Addendum)
[facility]   MRN: [id number] DOB: 1991/09/27  Subjective:   Nancy Mendoza is a 29 y.o. female presenting for 1 day history nausea, headaches, body aches, general malaise.  Patient has a son that tested positive for COVID-19 today.  She is pregnant.  No current facility-administered medications for this encounter.  Current Outpatient Medications:    OVER THE COUNTER MEDICATION, Take 1 each by mouth daily., Disp: , Rfl:    Prenatal Multivit-Min-Fe-FA (PRE-NATAL PO), Take by mouth., Disp: , Rfl:    No Known Allergies  Past Medical History:  Diagnosis Date   Anemia    History of postpartum hemorrhage      Past Surgical History:  Procedure Laterality Date   CESAREAN SECTION     CESAREAN SECTION N/A 02/19/2021   Procedure: REPEAT CESAREAN SECTION;  Surgeon: Carrington Clamp, MD;  Location: [facility] LD ORS;  Service: Obstetrics;  Laterality: N/A;   DILATION AND CURETTAGE OF UTERUS      Family History  Problem Relation Age of Onset   COPD Mother    Hypertension Mother    Healthy Father    Sleep apnea Father    Hypertension Father     Social History   Tobacco Use   Smoking status: Never   Smokeless tobacco: Never  Substance Use Topics   Alcohol use: Never   Drug use: No    ROS   Objective:   Vitals: BP 101/63 (BP Location: Right Arm)   Pulse (S) (!) 125   Temp 97.8 F (36.6 C) (Oral)   Resp 18   LMP 05/22/2020   SpO2 99%   Pulse recheck was 100-101.  Physical Exam Constitutional:      General: She is not in acute distress.    Appearance: Normal appearance. She is well-developed. She is not ill-appearing, toxic-appearing or diaphoretic.  HENT:     Head: Normocephalic and atraumatic.     Nose: Nose normal.     Mouth/Throat:     Mouth: Mucous membranes are moist.  Eyes:     Extraocular Movements: Extraocular movements intact.     Pupils: Pupils are equal, round, and reactive to light.  Cardiovascular:     Rate and Rhythm: Normal  rate and regular rhythm.     Pulses: Normal pulses.     Heart sounds: Normal heart sounds. No murmur heard. No friction rub. No gallop.   Pulmonary:     Effort: Pulmonary effort is normal. No respiratory distress.     Breath sounds: Normal breath sounds. No stridor. No wheezing, rhonchi or rales.  Skin:    General: Skin is warm and dry.     Findings: No rash.  Neurological:     Mental Status: She is alert and oriented to person, place, and time.  Psychiatric:        Mood and Affect: Mood normal.        Behavior: Behavior normal.        Thought Content: Thought content normal.     Assessment and Plan :   PDMP not reviewed this encounter.  1. Viral syndrome   2. Close exposure to COVID-19 virus   3. Cough   4. Body aches     High suspicion for COVID-19 given her close exposure.  Recommend supportive care.  Testing pending. Counseled patient on potential for adverse effects with medications prescribed/recommended today, ER and return-to-clinic precautions discussed, patient verbalized understanding.    Wallis Bamberg, [place] 12/26/20 1927  Per  amendment request, history was changed to reflect correct history.   Wallis Bamberg, [place] 06/07/22 [phone]

## 2020-12-27 LAB — SARS CORONAVIRUS 2 (TAT 6-24 HRS): SARS Coronavirus 2: POSITIVE — AB

## 2021-02-01 DIAGNOSIS — Z3480 Encounter for supervision of other normal pregnancy, unspecified trimester: Secondary | ICD-10-CM | POA: Diagnosis not present

## 2021-02-07 DIAGNOSIS — R5383 Other fatigue: Secondary | ICD-10-CM | POA: Diagnosis not present

## 2021-02-11 ENCOUNTER — Encounter (HOSPITAL_COMMUNITY): Payer: Self-pay | Admitting: *Deleted

## 2021-02-11 ENCOUNTER — Encounter (HOSPITAL_COMMUNITY): Payer: Self-pay

## 2021-02-11 NOTE — Patient Instructions (Signed)
Nancy Mendoza  02/11/2021   Your procedure is scheduled on:  02/19/2021  Arrive at 1000 at Entrance C on [address] at [facility]  and [employer]. You are invited to use the FREE valet parking or use the Visitor's parking deck.  Pick up the phone at the desk and dial [phone].  Call this number if you have problems the morning of surgery: [phone]  Remember:   Do not eat food:(After Midnight) Desps de medianoche.  Do not drink clear liquids: (After Midnight) Desps de medianoche.  Take these medicines the morning of surgery with A SIP OF WATER:  none   Do not wear jewelry, make-up or nail polish.  Do not wear lotions, powders, or perfumes. Do not wear deodorant.  Do not shave 48 hours prior to surgery.  Do not bring valuables to the hospital.  [facility] is not   responsible for any belongings or valuables brought to the hospital.  Contacts, dentures or bridgework may not be worn into surgery.  Leave suitcase in the car. After surgery it may be brought to your room.  For patients admitted to the hospital, checkout time is 11:00 AM the day of              discharge.      Please read over the following fact sheets that you were given:     Preparing for Surgery

## 2021-02-13 ENCOUNTER — Other Ambulatory Visit: Payer: Self-pay | Admitting: Obstetrics and Gynecology

## 2021-02-14 ENCOUNTER — Other Ambulatory Visit: Payer: Self-pay

## 2021-02-14 ENCOUNTER — Non-Acute Institutional Stay (HOSPITAL_COMMUNITY)
Admission: RE | Admit: 2021-02-14 | Discharge: 2021-02-14 | Disposition: A | Discharge status: Home / Self Care | Payer: Medicaid Other | Source: Ambulatory Visit | Attending: Internal Medicine | Admitting: Internal Medicine

## 2021-02-14 DIAGNOSIS — O99019 Anemia complicating pregnancy, unspecified trimester: Secondary | ICD-10-CM | POA: Diagnosis not present

## 2021-02-14 MED ORDER — SODIUM CHLORIDE 0.9 % IV SOLN
INTRAVENOUS | Status: DC | PRN
  Administered 2021-02-14: 250 mL via INTRAVENOUS

## 2021-02-14 MED ORDER — SODIUM CHLORIDE 0.9 % IV SOLN
500.0000 mg | Freq: Once | INTRAVENOUS | Status: AC
  Administered 2021-02-14: 500 mg via INTRAVENOUS
  Filled 2021-02-14: qty 25

## 2021-02-14 NOTE — Progress Notes (Signed)
Patient received IV Venofer  as ordered by Janey Greaser  MD. Observed for at least 30 minutes post infusion. Tolerated well, vitals stable, discharge instructions given , verbalized understanding. Patient alert, oriented, and ambulatory at the time of discharge.

## 2021-02-14 NOTE — Discharge Instructions (Signed)

## 2021-02-18 ENCOUNTER — Encounter (HOSPITAL_COMMUNITY)
Admission: RE | Admit: 2021-02-18 | Discharge: 2021-02-18 | Disposition: A | Discharge status: Home / Self Care | Payer: Medicaid Other | Source: Ambulatory Visit | Attending: Obstetrics and Gynecology | Admitting: Obstetrics and Gynecology

## 2021-02-18 ENCOUNTER — Other Ambulatory Visit: Payer: Self-pay

## 2021-02-18 DIAGNOSIS — Z01812 Encounter for preprocedural laboratory examination: Secondary | ICD-10-CM | POA: Insufficient documentation

## 2021-02-18 HISTORY — DX: Personal history of other complications of pregnancy, childbirth and the puerperium: Z87.59

## 2021-02-18 HISTORY — DX: Anemia, unspecified: D64.9

## 2021-02-18 LAB — TYPE AND SCREEN
ABO/RH(D): O POS
Antibody Screen: NEGATIVE

## 2021-02-18 LAB — CBC
HCT: 31.4 % — ABNORMAL LOW (ref 36.0–46.0)
Hemoglobin: 9.9 g/dL — ABNORMAL LOW (ref 12.0–15.0)
MCH: 27 pg (ref 26.0–34.0)
MCHC: 31.5 g/dL (ref 30.0–36.0)
MCV: 85.6 fL (ref 80.0–100.0)
Platelets: 291 10*3/uL (ref 150–400)
RBC: 3.67 MIL/uL — ABNORMAL LOW (ref 3.87–5.11)
RDW: 14.7 % (ref 11.5–15.5)
WBC: 12.5 10*3/uL — ABNORMAL HIGH (ref 4.0–10.5)
nRBC: 0 % (ref 0.0–0.2)

## 2021-02-19 ENCOUNTER — Encounter (HOSPITAL_COMMUNITY): Payer: Self-pay | Admitting: Obstetrics and Gynecology

## 2021-02-19 ENCOUNTER — Inpatient Hospital Stay (HOSPITAL_COMMUNITY): Payer: Medicaid Other

## 2021-02-19 ENCOUNTER — Inpatient Hospital Stay (HOSPITAL_COMMUNITY)
Admission: RE | Admit: 2021-02-19 | Discharge: 2021-02-21 | DRG: 788 | Disposition: A | Discharge status: Home / Self Care | Payer: Medicaid Other | Attending: Obstetrics and Gynecology | Admitting: Obstetrics and Gynecology

## 2021-02-19 ENCOUNTER — Other Ambulatory Visit: Payer: Self-pay

## 2021-02-19 ENCOUNTER — Encounter (HOSPITAL_COMMUNITY)
Admission: RE | Disposition: A | Discharge status: Home / Self Care | Payer: Self-pay | Source: Home / Self Care | Attending: Obstetrics and Gynecology

## 2021-02-19 DIAGNOSIS — Z3A39 39 weeks gestation of pregnancy: Secondary | ICD-10-CM | POA: Diagnosis not present

## 2021-02-19 DIAGNOSIS — O99334 Smoking (tobacco) complicating childbirth: Secondary | ICD-10-CM | POA: Diagnosis not present

## 2021-02-19 DIAGNOSIS — D649 Anemia, unspecified: Secondary | ICD-10-CM | POA: Diagnosis not present

## 2021-02-19 DIAGNOSIS — O34219 Maternal care for unspecified type scar from previous cesarean delivery: Secondary | ICD-10-CM | POA: Diagnosis not present

## 2021-02-19 DIAGNOSIS — O9902 Anemia complicating childbirth: Secondary | ICD-10-CM | POA: Diagnosis not present

## 2021-02-19 DIAGNOSIS — O34211 Maternal care for low transverse scar from previous cesarean delivery: Secondary | ICD-10-CM | POA: Diagnosis not present

## 2021-02-19 DIAGNOSIS — F17200 Nicotine dependence, unspecified, uncomplicated: Secondary | ICD-10-CM | POA: Diagnosis not present

## 2021-02-19 DIAGNOSIS — Z3A00 Weeks of gestation of pregnancy not specified: Secondary | ICD-10-CM | POA: Diagnosis not present

## 2021-02-19 LAB — RPR: RPR Ser Ql: NONREACTIVE

## 2021-02-19 SURGERY — Surgical Case
Anesthesia: Spinal

## 2021-02-19 MED ORDER — OXYTOCIN-SODIUM CHLORIDE 30-0.9 UT/500ML-% IV SOLN
INTRAVENOUS | Status: DC | PRN
  Administered 2021-02-19 (×2): 150 mL via INTRAVENOUS

## 2021-02-19 MED ORDER — KETOROLAC TROMETHAMINE 30 MG/ML IJ SOLN
INTRAMUSCULAR | Status: AC
  Filled 2021-02-19: qty 1

## 2021-02-19 MED ORDER — NALBUPHINE HCL 10 MG/ML IJ SOLN: 5.0000 mg | Freq: Once | INTRAMUSCULAR | Status: DC | PRN

## 2021-02-19 MED ORDER — SCOPOLAMINE 1 MG/3DAYS TD PT72
MEDICATED_PATCH | TRANSDERMAL | Status: AC
  Filled 2021-02-19: qty 1

## 2021-02-19 MED ORDER — ACETAMINOPHEN 500 MG PO TABS
ORAL_TABLET | ORAL | Status: AC
  Filled 2021-02-19: qty 2

## 2021-02-19 MED ORDER — MENTHOL 3 MG MT LOZG: 1.0000 | LOZENGE | OROMUCOSAL | Status: DC | PRN

## 2021-02-19 MED ORDER — DEXAMETHASONE SODIUM PHOSPHATE 4 MG/ML IJ SOLN
INTRAMUSCULAR | Status: DC | PRN
  Administered 2021-02-19: 10 mg via INTRAVENOUS

## 2021-02-19 MED ORDER — COCONUT OIL OIL: 1.0000 "application " | TOPICAL_OIL | Status: DC | PRN

## 2021-02-19 MED ORDER — METHYLERGONOVINE MALEATE 0.2 MG/ML IJ SOLN: 0.2000 mg | INTRAMUSCULAR | Status: DC | PRN

## 2021-02-19 MED ORDER — CEFAZOLIN SODIUM-DEXTROSE 2-4 GM/100ML-% IV SOLN
2.0000 g | INTRAVENOUS | Status: AC
  Administered 2021-02-19: 2 g via INTRAVENOUS

## 2021-02-19 MED ORDER — KETOROLAC TROMETHAMINE 30 MG/ML IJ SOLN: 30.0000 mg | Freq: Once | INTRAMUSCULAR | Status: DC | PRN

## 2021-02-19 MED ORDER — OXYCODONE HCL 5 MG/5ML PO SOLN: 5.0000 mg | Freq: Once | ORAL | Status: DC | PRN

## 2021-02-19 MED ORDER — SODIUM CHLORIDE 0.9% FLUSH: 3.0000 mL | INTRAVENOUS | Status: DC | PRN

## 2021-02-19 MED ORDER — LORATADINE 10 MG PO TABS
10.0000 mg | ORAL_TABLET | Freq: Every day | ORAL | Status: DC
  Administered 2021-02-20: 10 mg via ORAL
  Filled 2021-02-19: qty 1

## 2021-02-19 MED ORDER — SOD CITRATE-CITRIC ACID 500-334 MG/5ML PO SOLN
ORAL | Status: AC
  Filled 2021-02-19: qty 30

## 2021-02-19 MED ORDER — WITCH HAZEL-GLYCERIN EX PADS: 1.0000 "application " | MEDICATED_PAD | CUTANEOUS | Status: DC | PRN

## 2021-02-19 MED ORDER — DIPHENHYDRAMINE HCL 25 MG PO CAPS: 25.0000 mg | ORAL_CAPSULE | Freq: Four times a day (QID) | ORAL | Status: DC | PRN

## 2021-02-19 MED ORDER — METHYLERGONOVINE MALEATE 0.2 MG PO TABS: 0.2000 mg | ORAL_TABLET | ORAL | Status: DC | PRN

## 2021-02-19 MED ORDER — CELECOXIB 200 MG PO CAPS
400.0000 mg | ORAL_CAPSULE | ORAL | Status: AC
  Administered 2021-02-19: 400 mg via ORAL

## 2021-02-19 MED ORDER — OXYTOCIN-SODIUM CHLORIDE 30-0.9 UT/500ML-% IV SOLN: 2.5000 [IU]/h | INTRAVENOUS | Status: AC

## 2021-02-19 MED ORDER — PRENATAL MULTIVITAMIN CH
1.0000 | ORAL_TABLET | Freq: Every day | ORAL | Status: DC
  Administered 2021-02-20: 1 via ORAL
  Filled 2021-02-19: qty 1

## 2021-02-19 MED ORDER — ONDANSETRON HCL 4 MG/2ML IJ SOLN
4.0000 mg | Freq: Three times a day (TID) | INTRAMUSCULAR | Status: DC | PRN
  Administered 2021-02-19: 4 mg via INTRAVENOUS

## 2021-02-19 MED ORDER — DIBUCAINE (PERIANAL) 1 % EX OINT
1.0000 | TOPICAL_OINTMENT | CUTANEOUS | Status: DC | PRN
Start: 2021-02-19 — End: 2021-02-21

## 2021-02-19 MED ORDER — FERROUS SULFATE 325 (65 FE) MG PO TABS: 325.0000 mg | ORAL_TABLET | Freq: Every day | ORAL | Status: DC

## 2021-02-19 MED ORDER — ZOLPIDEM TARTRATE 5 MG PO TABS: 5.0000 mg | ORAL_TABLET | Freq: Every evening | ORAL | Status: DC | PRN

## 2021-02-19 MED ORDER — ONDANSETRON 4 MG PO TBDP: 8.0000 mg | ORAL_TABLET | Freq: Two times a day (BID) | ORAL | Status: DC | PRN

## 2021-02-19 MED ORDER — TETANUS-DIPHTH-ACELL PERTUSSIS 5-2.5-18.5 LF-MCG/0.5 IM SUSY: 0.5000 mL | PREFILLED_SYRINGE | Freq: Once | INTRAMUSCULAR | Status: DC

## 2021-02-19 MED ORDER — BISACODYL 10 MG RE SUPP: 10.0000 mg | Freq: Every day | RECTAL | Status: DC | PRN

## 2021-02-19 MED ORDER — LACTATED RINGERS IV SOLN: INTRAVENOUS | Status: DC

## 2021-02-19 MED ORDER — CELECOXIB 200 MG PO CAPS
ORAL_CAPSULE | ORAL | Status: AC
  Filled 2021-02-19: qty 2

## 2021-02-19 MED ORDER — PROMETHAZINE HCL 25 MG/ML IJ SOLN: 6.2500 mg | INTRAMUSCULAR | Status: DC | PRN

## 2021-02-19 MED ORDER — OXYCODONE HCL 5 MG PO TABS: 5.0000 mg | ORAL_TABLET | Freq: Once | ORAL | Status: DC | PRN

## 2021-02-19 MED ORDER — DIPHENHYDRAMINE HCL 50 MG/ML IJ SOLN: 12.5000 mg | INTRAMUSCULAR | Status: DC | PRN

## 2021-02-19 MED ORDER — FENTANYL CITRATE (PF) 100 MCG/2ML IJ SOLN
INTRAMUSCULAR | Status: AC
  Filled 2021-02-19: qty 2

## 2021-02-19 MED ORDER — PHENYLEPHRINE HCL-NACL 20-0.9 MG/250ML-% IV SOLN
INTRAVENOUS | Status: AC
  Filled 2021-02-19: qty 250

## 2021-02-19 MED ORDER — DEXAMETHASONE SODIUM PHOSPHATE 4 MG/ML IJ SOLN
INTRAMUSCULAR | Status: AC
  Filled 2021-02-19: qty 1

## 2021-02-19 MED ORDER — STERILE WATER FOR IRRIGATION IR SOLN
Status: DC | PRN
  Administered 2021-02-19: 1000 mL

## 2021-02-19 MED ORDER — NALOXONE HCL 4 MG/10ML IJ SOLN
1.0000 ug/kg/h | INTRAMUSCULAR | Status: DC | PRN
  Filled 2021-02-19: qty 5

## 2021-02-19 MED ORDER — POVIDONE-IODINE 10 % EX SWAB
2.0000 "application " | Freq: Once | CUTANEOUS | Status: AC
  Administered 2021-02-19: 2 via TOPICAL

## 2021-02-19 MED ORDER — ONDANSETRON HCL 4 MG/2ML IJ SOLN
INTRAMUSCULAR | Status: AC
  Filled 2021-02-19: qty 2

## 2021-02-19 MED ORDER — FENTANYL CITRATE (PF) 100 MCG/2ML IJ SOLN
INTRAMUSCULAR | Status: DC | PRN
  Administered 2021-02-19: 15 ug via INTRATHECAL

## 2021-02-19 MED ORDER — ACETAMINOPHEN 500 MG PO TABS
1000.0000 mg | ORAL_TABLET | ORAL | Status: AC
  Administered 2021-02-19: 1000 mg via ORAL

## 2021-02-19 MED ORDER — SODIUM CHLORIDE 0.9 % IR SOLN
Status: DC | PRN
  Administered 2021-02-19: 1

## 2021-02-19 MED ORDER — BUPIVACAINE IN DEXTROSE 0.75-8.25 % IT SOLN
INTRATHECAL | Status: DC | PRN
  Administered 2021-02-19: 1.6 mL via INTRATHECAL

## 2021-02-19 MED ORDER — DIPHENHYDRAMINE HCL 25 MG PO CAPS: 25.0000 mg | ORAL_CAPSULE | ORAL | Status: DC | PRN

## 2021-02-19 MED ORDER — FAMOTIDINE 20 MG PO TABS: 20.0000 mg | ORAL_TABLET | Freq: Every day | ORAL | Status: DC | PRN

## 2021-02-19 MED ORDER — MORPHINE SULFATE (PF) 0.5 MG/ML IJ SOLN
INTRAMUSCULAR | Status: DC | PRN
  Administered 2021-02-19: 150 ug via INTRATHECAL

## 2021-02-19 MED ORDER — SOD CITRATE-CITRIC ACID 500-334 MG/5ML PO SOLN
30.0000 mL | ORAL | Status: AC
  Administered 2021-02-19: 30 mL via ORAL

## 2021-02-19 MED ORDER — MORPHINE SULFATE (PF) 0.5 MG/ML IJ SOLN
INTRAMUSCULAR | Status: AC
  Filled 2021-02-19: qty 10

## 2021-02-19 MED ORDER — FLEET ENEMA 7-19 GM/118ML RE ENEM: 1.0000 | ENEMA | Freq: Every day | RECTAL | Status: DC | PRN

## 2021-02-19 MED ORDER — NALBUPHINE HCL 10 MG/ML IJ SOLN: 5.0000 mg | INTRAMUSCULAR | Status: DC | PRN

## 2021-02-19 MED ORDER — PHENYLEPHRINE HCL-NACL 20-0.9 MG/250ML-% IV SOLN
INTRAVENOUS | Status: DC | PRN
  Administered 2021-02-19: 60 ug/min via INTRAVENOUS

## 2021-02-19 MED ORDER — SIMETHICONE 80 MG PO CHEW
80.0000 mg | CHEWABLE_TABLET | ORAL | Status: DC | PRN
  Administered 2021-02-19: 80 mg via ORAL

## 2021-02-19 MED ORDER — PHENYLEPHRINE HCL (PRESSORS) 10 MG/ML IV SOLN
INTRAVENOUS | Status: DC | PRN
  Administered 2021-02-19: 80 ug via INTRAVENOUS

## 2021-02-19 MED ORDER — NALOXONE HCL 0.4 MG/ML IJ SOLN: 0.4000 mg | INTRAMUSCULAR | Status: DC | PRN

## 2021-02-19 MED ORDER — MEASLES, MUMPS & RUBELLA VAC IJ SOLR: 0.5000 mL | Freq: Once | INTRAMUSCULAR | Status: DC

## 2021-02-19 MED ORDER — MEPERIDINE HCL 25 MG/ML IJ SOLN: 6.2500 mg | INTRAMUSCULAR | Status: DC | PRN

## 2021-02-19 MED ORDER — HYDROMORPHONE HCL 1 MG/ML IJ SOLN: 0.2500 mg | INTRAMUSCULAR | Status: DC | PRN

## 2021-02-19 MED ORDER — OXYCODONE-ACETAMINOPHEN 5-325 MG PO TABS
1.0000 | ORAL_TABLET | ORAL | Status: DC | PRN
  Administered 2021-02-20 (×2): 1 via ORAL
  Administered 2021-02-21: 2 via ORAL
  Filled 2021-02-19: qty 2
  Filled 2021-02-19: qty 1
  Filled 2021-02-19: qty 2

## 2021-02-19 MED ORDER — CEFAZOLIN SODIUM-DEXTROSE 2-4 GM/100ML-% IV SOLN
INTRAVENOUS | Status: AC
  Filled 2021-02-19: qty 100

## 2021-02-19 MED ORDER — SIMETHICONE 80 MG PO CHEW
80.0000 mg | CHEWABLE_TABLET | Freq: Three times a day (TID) | ORAL | Status: DC
  Administered 2021-02-20 – 2021-02-21 (×3): 80 mg via ORAL
  Filled 2021-02-19 (×4): qty 1

## 2021-02-19 MED ORDER — SENNOSIDES-DOCUSATE SODIUM 8.6-50 MG PO TABS
2.0000 | ORAL_TABLET | ORAL | Status: DC
  Administered 2021-02-20 – 2021-02-21 (×2): 2 via ORAL
  Filled 2021-02-19 (×2): qty 2

## 2021-02-19 MED ORDER — SCOPOLAMINE 1 MG/3DAYS TD PT72
1.0000 | MEDICATED_PATCH | Freq: Once | TRANSDERMAL | Status: DC
  Administered 2021-02-19: 1.5 mg via TRANSDERMAL

## 2021-02-19 MED ORDER — FERROUS SULFATE 325 (65 FE) MG PO TABS
325.0000 mg | ORAL_TABLET | Freq: Two times a day (BID) | ORAL | Status: DC
  Administered 2021-02-20 – 2021-02-21 (×3): 325 mg via ORAL
  Filled 2021-02-19 (×4): qty 1

## 2021-02-19 MED ORDER — IBUPROFEN 800 MG PO TABS
800.0000 mg | ORAL_TABLET | Freq: Three times a day (TID) | ORAL | Status: DC
  Administered 2021-02-19 – 2021-02-21 (×6): 800 mg via ORAL
  Filled 2021-02-19 (×6): qty 1

## 2021-02-19 SURGICAL SUPPLY — 32 items
BENZOIN TINCTURE PRP APPL 2/3 (GAUZE/BANDAGES/DRESSINGS) ×2 IMPLANT
CHLORAPREP W/TINT 26ML (MISCELLANEOUS) ×2 IMPLANT
CLAMP CORD UMBIL (MISCELLANEOUS) IMPLANT
CLOSURE STERI STRIP 1/2 X4 (GAUZE/BANDAGES/DRESSINGS) ×2 IMPLANT
CLOTH BEACON ORANGE TIMEOUT ST (SAFETY) ×2 IMPLANT
DRSG OPSITE POSTOP 4X10 (GAUZE/BANDAGES/DRESSINGS) ×2 IMPLANT
ELECT REM PT RETURN 9FT ADLT (ELECTROSURGICAL) ×2
ELECTRODE REM PT RTRN 9FT ADLT (ELECTROSURGICAL) ×1 IMPLANT
EXTRACTOR VACUUM BELL STYLE (SUCTIONS) IMPLANT
GLOVE BIO SURGEON STRL SZ7 (GLOVE) ×2 IMPLANT
GLOVE BIOGEL PI IND STRL 7.0 (GLOVE) ×1 IMPLANT
GLOVE BIOGEL PI INDICATOR 7.0 (GLOVE) ×1
GOWN STRL REUS W/TWL LRG LVL3 (GOWN DISPOSABLE) ×4 IMPLANT
KIT ABG SYR 3ML LUER SLIP (SYRINGE) IMPLANT
NEEDLE HYPO 25X5/8 SAFETYGLIDE (NEEDLE) IMPLANT
NS IRRIG 1000ML POUR BTL (IV SOLUTION) ×2 IMPLANT
PACK C SECTION WH (CUSTOM PROCEDURE TRAY) ×2 IMPLANT
PAD OB MATERNITY 4.3X12.25 (PERSONAL CARE ITEMS) ×2 IMPLANT
PENCIL SMOKE EVAC W/HOLSTER (ELECTROSURGICAL) ×2 IMPLANT
RTRCTR C-SECT PINK 25CM LRG (MISCELLANEOUS) ×2 IMPLANT
STRIP CLOSURE SKIN 1/2X4 (GAUZE/BANDAGES/DRESSINGS) ×2 IMPLANT
SUT MNCRL 0 VIOLET CTX 36 (SUTURE) ×2 IMPLANT
SUT MONOCRYL 0 CTX 36 (SUTURE) ×4
SUT PLAIN 2 0 XLH (SUTURE) IMPLANT
SUT VIC AB 0 CT1 27 (SUTURE) ×4
SUT VIC AB 0 CT1 27XBRD ANBCTR (SUTURE) ×2 IMPLANT
SUT VIC AB 2-0 CT1 27 (SUTURE) ×2
SUT VIC AB 2-0 CT1 TAPERPNT 27 (SUTURE) ×1 IMPLANT
SUT VIC AB 4-0 KS 27 (SUTURE) ×2 IMPLANT
TOWEL OR 17X24 6PK STRL BLUE (TOWEL DISPOSABLE) ×2 IMPLANT
TRAY FOLEY W/BAG SLVR 14FR LF (SET/KITS/TRAYS/PACK) ×2 IMPLANT
WATER STERILE IRR 1000ML POUR (IV SOLUTION) ×2 IMPLANT

## 2021-02-19 NOTE — Anesthesia Preprocedure Evaluation (Addendum)
Anesthesia Evaluation  Patient identified by MRN, date of birth, ID band Patient awake    Reviewed: Allergy & Precautions, H&P , NPO status , Patient's Chart, lab work & pertinent test results  Airway Mallampati: II  TM Distance: >3 FB Neck ROM: Full    Dental no notable dental hx.    Pulmonary neg pulmonary ROS, Current Smoker and Patient abstained from smoking.,    Pulmonary exam normal breath sounds clear to auscultation       Cardiovascular negative cardio ROS Normal cardiovascular exam Rhythm:Regular Rate:Normal     Neuro/Psych negative neurological ROS  negative psych ROS   GI/Hepatic negative GI ROS, Neg liver ROS,   Endo/Other  negative endocrine ROS  Renal/GU negative Renal ROS  negative genitourinary   Musculoskeletal negative musculoskeletal ROS (+)   Abdominal   Peds negative pediatric ROS (+)  Hematology negative hematology ROS (+)   Anesthesia Other Findings   Reproductive/Obstetrics (+) Pregnancy                             Anesthesia Physical Anesthesia Plan  ASA: II  Anesthesia Plan: Spinal   Post-op Pain Management:    Induction:   PONV Risk Score and Plan: 2 and Ondansetron, Midazolam and Treatment may vary due to age or medical condition  Airway Management Planned: Simple Face Mask  Additional Equipment:   Intra-op Plan:   Post-operative Plan:   Informed Consent: I have reviewed the patients History and Physical, chart, labs and discussed the procedure including the risks, benefits and alternatives for the proposed anesthesia with the patient or authorized representative who has indicated his/her understanding and acceptance.     Dental advisory given  Plan Discussed with: CRNA  Anesthesia Plan Comments:        Anesthesia Quick Evaluation

## 2021-02-19 NOTE — Brief Op Note (Signed)
02/19/2021  12:27 PM  PATIENT:  Nancy Mendoza  29 y.o. female  PRE-OPERATIVE DIAGNOSIS:  Repeat Cesarean Section  POST-OPERATIVE DIAGNOSIS:  Repeat Cesarean Section  PROCEDURE:  Procedure(s): REPEAT CESAREAN SECTION (N/A)  SURGEON:  Surgeon(s) and Role:    * Carrington Clamp, MD - Primary    * Charlett Nose, MD - Assisting  ANESTHESIA:   spinal  EBL:  331 cc  SPECIMEN:  No Specimen  DISPOSITION OF SPECIMEN:  N/A  COUNTS:  YES  TOURNIQUET:  * No tourniquets in log *  DICTATION: .Note written in EPIC  PLAN OF CARE: Admit to inpatient   PATIENT DISPOSITION:  PACU - hemodynamically stable.   Delay start of Pharmacological VTE agent (>24hrs) due to surgical blood loss or risk of bleeding: not applicable

## 2021-02-19 NOTE — Transfer of Care (Signed)
Immediate Anesthesia Transfer of Care Note  Patient: Nancy Mendoza  Procedure(s) Performed: REPEAT CESAREAN SECTION (N/A )  Patient Location: PACU  Anesthesia Type:Spinal  Level of Consciousness: awake, alert  and oriented  Airway & Oxygen Therapy: Patient Spontanous Breathing  Post-op Assessment: Report given to RN and Post -op Vital signs reviewed and stable  Post vital signs: Reviewed and stable  Last Vitals:  Vitals Value Taken Time  BP 106/67 02/19/21 1235  Temp    Pulse 81 02/19/21 1239  Resp 20 02/19/21 1239  SpO2 97 % 02/19/21 1239  Vitals shown include unvalidated device data.  Last Pain:  Vitals:   02/19/21 1007  TempSrc: Oral         Complications: No complications documented.

## 2021-02-19 NOTE — Anesthesia Procedure Notes (Signed)
Spinal  Patient location during procedure: OB Start time: 02/19/2021 11:35 AM End time: 02/19/2021 11:40 AM Staffing Performed: anesthesiologist  Anesthesiologist: Lowella Curb, MD Preanesthetic Checklist Completed: patient identified, IV checked, risks and benefits discussed, surgical consent, monitors and equipment checked, pre-op evaluation and timeout performed Spinal Block Patient position: sitting Prep: DuraPrep and site prepped and draped Patient monitoring: heart rate, cardiac monitor, continuous pulse ox and blood pressure Approach: midline Location: L3-4 Injection technique: single-shot Needle Needle type: Pencan  Needle gauge: 24 G Needle length: 10 cm Assessment Sensory level: T4 Additional Notes SAB placed by [facility]

## 2021-02-19 NOTE — Op Note (Signed)
02/19/2021  12:27 PM  PATIENT:  Nancy Mendoza  30 y.o. female  PRE-OPERATIVE DIAGNOSIS:  Repeat Cesarean Section  POST-OPERATIVE DIAGNOSIS:  Repeat Cesarean Section  PROCEDURE:  Procedure(s): REPEAT CESAREAN SECTION (N/A)  SURGEON:  Surgeon(s) and Role:    * Carrington Clamp, MD - Primary    * Charlett Nose, MD - Assisting  ANESTHESIA:   spinal  EBL:  331 cc  SPECIMEN:  No Specimen  DISPOSITION OF SPECIMEN:  N/A  COUNTS:  YES  TOURNIQUET:  * No tourniquets in log *  DICTATION: .Note written in EPIC  PLAN OF CARE: Admit to inpatient   PATIENT DISPOSITION:  PACU - hemodynamically stable.   Delay start of Pharmacological VTE agent (>24hrs) due to surgical blood loss or risk of bleeding: not applicable  Complications:  none Medications:  Ancef, Pitocin Findings:  Baby female, Apgars 9,9, weight P.   Normal tubes, ovaries and uterus seen.  Baby was skin to skin with mother after birth in the OR.  Technique:  After adequate spinal anesthesia was achieved, the patient was prepped and draped in usual sterile fashion.  A foley catheter was used to drain the bladder.  A pfannanstiel incision was made with the scalpel and carried down to the fascia with the bovie cautery. The fascia was incised in the midline with the scalpel and carried in a transverse curvilinear manner bilaterally.  The fascia was reflected superiorly and inferiorly off the rectus muscles and the muscles split in the midline.  A bowel free portion of the peritoneum was entered bluntly and then extended in a superior and inferior manner with good visualization of the bowel and bladder.  The Alexis instrument was then placed and the vesico-uterine fascia tented up and incised in a transverse curvilinear manner.  A 2 cm transverse incision was made in the upper portion of the lower uterine segment until the amnion was exposed.   The incision was extended transversely in a blunt manner.  Clear fluid was  noted and the baby delivered in the vertex presentation without complication.  The baby was bulb suctioned and the cord was clamped and cut aftet stripping blood from cord into baby.  The baby was then handed to awaiting Neonatology.  The placenta was then delivered manually and the uterus cleared of all debris.  The uterine incision was then closed with a running lock stitch of 0 monocryl.  An imbricating layer of 0 monocryl was closed as well. Excellent hemostasis of the uterine incision was achieved and the abdomen was cleared with irrigation.  The peritoneum was closed with a running stitch of 2-0 vicryl.  This incorporated the rectus muscles as a separate layer.  The fascia was then closed with a running stitch of 0 vicryl.  The subcutaneous layer was closed with interrupted  stitches of 2-0 plain gut.  The skin was closed with 4-0 vicryl on a Keith needle and steri-strips.  The patient tolerated the procedure well and was returned to the recovery room in stable condition.  All counts were correct times three.  Loney Laurence

## 2021-02-19 NOTE — H&P (Signed)
30 y.o.  G3P2 [redacted]w[redacted]d comes in for a repeat cesarean section at term.  Patient has good fetal movement and no bleeding.    Past Medical History:  Diagnosis Date  . Anemia   . History of postpartum hemorrhage     Past Surgical History:  Procedure Laterality Date  . CESAREAN SECTION    . DILATION AND CURETTAGE OF UTERUS      OB History  Gravida Para Term Preterm AB Living  3 2          SAB IAB Ectopic Multiple Live Births               # Outcome Date GA Lbr Len/2nd Weight Sex Delivery Anes PTL Lv  3 Current           2 Para      CS-LTranv     1 Para      CS-LTranv       Social History   Socioeconomic History  . Marital status: Divorced    Spouse name: Not on file  . Number of children: Not on file  . Years of education: Not on file  . Highest education level: Not on file  Occupational History  . Not on file  Tobacco Use  . Smoking status: Light Tobacco Smoker  . Smokeless tobacco: Never Used  Substance and Sexual Activity  . Alcohol use: Yes  . Drug use: No  . Sexual activity: Yes  Other Topics Concern  . Not on file  Social History Narrative  . Not on file   Social Determinants of Health   Financial Resource Strain: Not on file  Food Insecurity: Not on file  Transportation Needs: Not on file  Physical Activity: Not on file  Stress: Not on file  Social Connections: Not on file  Intimate Partner Violence: Not on file   Patient has no known allergies.   Prenatal Course: uncomplicated.   Prenatal Transfer Tool  Maternal Diabetes: No Genetic Screening: Normal Maternal Ultrasounds/Referrals: Normal Fetal Ultrasounds or other Referrals:  None Maternal Substance Abuse:  No Significant Maternal Medications:  None Significant Maternal Lab Results: Group B Strep negative  There were no vitals filed for this visit.  Lungs/Cor:  NAD Abdomen:  soft, gravid Ex:  no cords, erythema SVE:  NA FHTs:  present  A/P   For repeat cesarean sectionat term.  All risks,  benefits and alternatives discussed with patient and she desires to proceed.  Loney Laurence

## 2021-02-19 NOTE — Anesthesia Postprocedure Evaluation (Signed)
Anesthesia Post Note  Patient: Statistician  Procedure(s) Performed: REPEAT CESAREAN SECTION (N/A )     Patient location during evaluation: PACU Anesthesia Type: Spinal Level of consciousness: awake and alert Pain management: pain level controlled Vital Signs Assessment: post-procedure vital signs reviewed and stable Respiratory status: spontaneous breathing, nonlabored ventilation and respiratory function stable Cardiovascular status: blood pressure returned to baseline and stable Postop Assessment: no apparent nausea or vomiting Anesthetic complications: no   No complications documented.  Last Vitals:  Vitals:   02/19/21 1330 02/19/21 1345  BP:    Pulse:    Resp: 16 16  Temp:    SpO2:      Last Pain:  Vitals:   02/19/21 1400  TempSrc:   PainSc: 0-No pain   Pain Goal:    LLE Motor Response: Purposeful movement (02/19/21 1400)   RLE Motor Response: Purposeful movement (02/19/21 1400)       Epidural/Spinal Function Cutaneous sensation: Tingles (02/19/21 1400), Patient able to flex knees: Yes (02/19/21 1400), Patient able to lift hips off bed: No (02/19/21 1400), Back pain beyond tenderness at insertion site: No (02/19/21 1400), Progressively worsening motor and/or sensory loss: No (02/19/21 1400), Bowel and/or bladder incontinence post epidural: No (02/19/21 1400)  Lowella Curb

## 2021-02-20 ENCOUNTER — Encounter (HOSPITAL_COMMUNITY): Payer: Self-pay | Admitting: Obstetrics and Gynecology

## 2021-02-20 LAB — CBC
HCT: 27.3 % — ABNORMAL LOW (ref 36.0–46.0)
Hemoglobin: 8.2 g/dL — ABNORMAL LOW (ref 12.0–15.0)
MCH: 26.2 pg (ref 26.0–34.0)
MCHC: 30 g/dL (ref 30.0–36.0)
MCV: 87.2 fL (ref 80.0–100.0)
Platelets: 267 10*3/uL (ref 150–400)
RBC: 3.13 MIL/uL — ABNORMAL LOW (ref 3.87–5.11)
RDW: 15.3 % (ref 11.5–15.5)
WBC: 16.2 10*3/uL — ABNORMAL HIGH (ref 4.0–10.5)
nRBC: 0 % (ref 0.0–0.2)

## 2021-02-20 LAB — BIRTH TISSUE RECOVERY COLLECTION (PLACENTA DONATION)

## 2021-02-20 NOTE — Progress Notes (Signed)
Patient is eating, ambulating, foley in.  Pain control is good.  +flatus, appropriate lochia, no complaints.  States no safety problems with discharge home (confidential admission).  States has support and will call office with any problems.  Vitals:   02/19/21 2200 02/20/21 0100 02/20/21 0504 02/20/21 0801  BP:  102/66 90/63 (!) 91/57  Pulse:  81 76 98  Resp: 18 16 16 18   Temp:  98.8 F (37.1 C) 98.1 F (36.7 C) 98.9 F (37.2 C)  TempSrc:  Oral Oral Oral  SpO2:  97% 99% 98%  Weight:      Height:        Fundus firm Incision: c/d/i Ext: no calf tenderness  Lab Results  Component Value Date   WBC 16.2 (H) 02/20/2021   HGB 8.2 (L) 02/20/2021   HCT 27.3 (L) 02/20/2021   MCV 87.2 02/20/2021   PLT 267 02/20/2021    --/--/O POS (02/21 1017)  A/P Post opday #1 s/p repeat C/S. Baby girl doing well. Anemia, continue iron bid Declines consult to social work.  Routine care.  Expect d/c 2/24.    3/24

## 2021-02-21 MED ORDER — OXYCODONE HCL 5 MG PO TABS: 5.0000 mg | ORAL_TABLET | ORAL | 0 refills | Status: DC | PRN

## 2021-02-21 MED ORDER — IBUPROFEN 600 MG PO TABS: 600.0000 mg | ORAL_TABLET | Freq: Four times a day (QID) | ORAL | 0 refills | Status: DC | PRN

## 2021-02-21 NOTE — Discharge Summary (Signed)
Postpartum Discharge Summary      Patient Name: Nancy Mendoza DOB: 03-19-1991 MRN: [id number]  Date of admission: 02/19/2021 Delivery date:02/19/2021  Delivering provider: Carrington Clamp  Date of discharge: 02/21/2021  Admitting diagnosis: Encounter for maternal care for low transverse scar from repeat cesarean delivery [O34.211] Intrauterine pregnancy: [redacted]w[redacted]d     Secondary diagnosis:  Active Problems:   Encounter for maternal care for low transverse scar from repeat cesarean delivery      Discharge diagnosis: Term Pregnancy Delivered                                              Post partum procedures:NA Augmentation: N/A Complications: None  Hospital course: Sceduled C/S   30 y.o. yo G3P1001 at [redacted]w[redacted]d was admitted to the hospital 02/19/2021 for scheduled cesarean section with the following indication:Elective Repeat.Delivery details are as follows:  Membrane Rupture Time/Date: 11:58 AM ,02/19/2021   Delivery Method:C-Section, Low Transverse  Details of operation can be found in separate operative note.  Patient had an uncomplicated postpartum course.  She is ambulating, tolerating a regular diet, passing flatus, and urinating well. Patient is discharged home in stable condition on  02/21/21        Newborn Data: Birth date:02/19/2021  Birth time:11:59 AM  Gender:Female  Living status:Living  Apgars:9 ,9  Weight:3560 g     Magnesium Sulfate received: No BMZ received: No Rhophylac:No   Physical exam  Vitals:   02/20/21 0504 02/20/21 0801 02/20/21 2123 02/21/21 0400  BP: 90/63 (!) 91/57 102/61 102/75  Pulse: 76 98 75 69  Resp: 16 18 18 16   Temp: 98.1 F (36.7 C) 98.9 F (37.2 C) 98.1 F (36.7 C) 97.8 F (36.6 C)  TempSrc: Oral Oral Oral Oral  SpO2: 99% 98% 100%   Weight:      Height:       General: alert, cooperative and no distress Lochia: appropriate Uterine Fundus: firm Incision: Healing well with no significant drainage DVT Evaluation: No evidence  of DVT seen on physical exam. Labs: Lab Results  Component Value Date   WBC 16.2 (H) 02/20/2021   HGB 8.2 (L) 02/20/2021   HCT 27.3 (L) 02/20/2021   MCV 87.2 02/20/2021   PLT 267 02/20/2021   CMP Latest Ref Rng & Units 08/28/2018  Glucose 70 - 99 mg/dL 86  BUN 6 - 20 mg/dL 13  Creatinine 08/30/2018 - 3.29 mg/dL 9.24  Sodium 2.68 - 341 mmol/L 139  Potassium 3.5 - 5.1 mmol/L 4.1  Chloride 98 - 111 mmol/L 104  CO2 22 - 32 mmol/L 23  Calcium 8.9 - 10.3 mg/dL 962)   2.2(L Score: Edinburgh Postnatal Depression Scale Screening Tool 02/19/2021  I have been able to laugh and see the funny side of things. (No Data)      After visit meds:  Allergies as of 02/21/2021   No Known Allergies     Medication List    STOP taking these medications   cetirizine 10 MG tablet Commonly known as: ZyrTEC Allergy   famotidine 20 MG tablet Commonly known as: PEPCID   ferrous sulfate 325 (65 FE) MG tablet   PRE-NATAL PO     TAKE these medications   ibuprofen 600 MG tablet Commonly known as: ADVIL Take 1 tablet (600 mg total) by mouth every 6 (six) hours as needed.   ondansetron 8  MG disintegrating tablet Commonly known as: ZOFRAN-ODT Take 8 mg by mouth 2 (two) times daily as needed for heartburn.   oxyCODONE 5 MG immediate release tablet Commonly known as: Roxicodone Take 1 tablet (5 mg total) by mouth every 4 (four) hours as needed for severe pain.            Discharge Care Instructions  (From admission, onward)         Start     Ordered   02/21/21 0000  Discharge wound care:       Comments: You may wash incision with soap and water.  Do not soak or submerge the incision for 2 weeks. Keep incision dry. You may need to keep a sanitary pad or panty liner between the incision and your clothing for comfort and to keep the incision dry. If you note drainage, increased pain, or increased redness of the incision, then please notify your physician.   02/21/21 1040            Discharge home in stable condition Infant Feeding: Bottle and Breast Infant Disposition:home with mother Discharge instruction: per After Visit Summary and Postpartum booklet. Activity: Advance as tolerated. Pelvic rest for 6 weeks.  Diet: routine diet Anticipated Birth Control: Unsure Postpartum Appointment:4 weeks Future Appointments:No future appointments. Follow up Visit:  Follow-up Information    Carrington Clamp, MD Follow up in 4 week(s).   Specialty: Obstetrics and Gynecology Why: For a postpartum evaluation Contact information: [address] [place] [place] [address] [phone]                   02/21/2021 Waynard Reeds, MD

## 2021-03-08 DIAGNOSIS — R5383 Other fatigue: Secondary | ICD-10-CM | POA: Diagnosis not present

## 2021-05-20 ENCOUNTER — Other Ambulatory Visit: Payer: Self-pay

## 2021-05-20 ENCOUNTER — Encounter (HOSPITAL_COMMUNITY): Payer: Self-pay

## 2021-05-20 ENCOUNTER — Ambulatory Visit (HOSPITAL_COMMUNITY)
Admission: EM | Admit: 2021-05-20 | Discharge: 2021-05-20 | Disposition: A | Discharge status: Home / Self Care | Payer: Medicaid Other | Attending: Internal Medicine | Admitting: Internal Medicine

## 2021-05-20 DIAGNOSIS — J029 Acute pharyngitis, unspecified: Secondary | ICD-10-CM | POA: Diagnosis not present

## 2021-05-20 DIAGNOSIS — Z20822 Contact with and (suspected) exposure to covid-19: Secondary | ICD-10-CM | POA: Diagnosis not present

## 2021-05-20 DIAGNOSIS — J069 Acute upper respiratory infection, unspecified: Secondary | ICD-10-CM

## 2021-05-20 LAB — POCT RAPID STREP A, ED / UC: Streptococcus, Group A Screen (Direct): NEGATIVE

## 2021-05-20 MED ORDER — LIDOCAINE VISCOUS HCL 2 % MT SOLN: OROMUCOSAL | 0 refills | Status: DC

## 2021-05-20 MED ORDER — BENZONATATE 200 MG PO CAPS: 200.0000 mg | ORAL_CAPSULE | Freq: Three times a day (TID) | ORAL | 0 refills | Status: DC

## 2021-05-20 NOTE — ED Provider Notes (Signed)
[facility]    CSN: [id number] Arrival date & time: 05/20/21  1856      History   Chief Complaint Chief Complaint  Patient presents with  . Cough  . Sore Throat    HPI Nancy Mendoza is a 30 y.o. female.   30 year old female comes in for 2-day history of URI symptoms.  Has had cough, sore throat.  Painful swallowing without trouble breathing, tripoding, drooling, trismus.  Denies fever, chills, body aches.  Denies rhinorrhea, nasal congestion.  Denies shortness of breath.     Past Medical History:  Diagnosis Date  . Anemia   . History of postpartum hemorrhage     Patient Active Problem List   Diagnosis Date Noted  . Encounter for maternal care for low transverse scar from repeat cesarean delivery 02/19/2021    Past Surgical History:  Procedure Laterality Date  . CESAREAN SECTION    . CESAREAN SECTION N/A 02/19/2021   Procedure: REPEAT CESAREAN SECTION;  Surgeon: Carrington Clamp, MD;  Location: [facility];  Service: Obstetrics;  Laterality: N/A;  . DILATION AND CURETTAGE OF UTERUS      OB History    Gravida  3   Para  3   Term  1   Preterm      AB      Living  1     SAB      IAB      Ectopic      Multiple  0   Live Births  1            Home Medications    Prior to Admission medications   Medication Sig Start Date End Date Taking? Authorizing Provider  benzonatate (TESSALON) 200 MG capsule Take 1 capsule (200 mg total) by mouth every 8 (eight) hours. 05/20/21  Yes Hendrix Console V, PA-C  lidocaine (XYLOCAINE) 2 % solution 5-15 mL gargle as needed 05/20/21  Yes Bowie Delia V, PA-C  ibuprofen (ADVIL) 600 MG tablet Take 1 tablet (600 mg total) by mouth every 6 (six) hours as needed. 02/21/21   Waynard Reeds, MD  ondansetron (ZOFRAN-ODT) 8 MG disintegrating tablet Take 8 mg by mouth 2 (two) times daily as needed for heartburn. 12/26/20   [provider]  oxyCODONE (ROXICODONE) 5 MG immediate release tablet Take 1 tablet (5 mg total)  by mouth every 4 (four) hours as needed for severe pain. 02/21/21   Waynard Reeds, MD    Family History Family History  Problem Relation Age of Onset  . COPD Mother   . Hypertension Mother   . Healthy Father   . Sleep apnea Father   . Hypertension Father     Social History Social History   Tobacco Use  . Smoking status: Light Tobacco Smoker  . Smokeless tobacco: Never Used  Substance Use Topics  . Alcohol use: Yes  . Drug use: No     Allergies   Patient has no known allergies.   Review of Systems Review of Systems  Reason unable to perform ROS: See HPI as above.     Physical Exam Triage Vital Signs ED Triage Vitals  Enc Vitals Group     BP 05/20/21 1959 138/74     Pulse Rate 05/20/21 1959 98     Resp 05/20/21 1959 18     Temp 05/20/21 1959 98.5 F (36.9 C)     Temp Source 05/20/21 1959 Oral     SpO2 05/20/21 1959 100 %  Weight --      Height --      Head Circumference --      Peak Flow --      Pain Score 05/20/21 1958 3     Pain Loc --      Pain Edu? --      Excl. in GC? --    No data found.  Updated Vital Signs BP 138/74 (BP Location: Left Arm)   Pulse 98   Temp 98.5 F (36.9 C) (Oral)   Resp 18   SpO2 100%   Physical Exam Constitutional:      General: She is not in acute distress.    Appearance: Normal appearance. She is not ill-appearing, toxic-appearing or diaphoretic.  HENT:     Head: Normocephalic and atraumatic.     Mouth/Throat:     Mouth: Mucous membranes are moist.     Pharynx: Oropharynx is clear. Uvula midline.     Tonsils: No tonsillar exudate. 1+ on the right. 1+ on the left.  Cardiovascular:     Rate and Rhythm: Normal rate and regular rhythm.     Heart sounds: Normal heart sounds. No murmur heard. No friction rub. No gallop.   Pulmonary:     Effort: Pulmonary effort is normal. No accessory muscle usage, prolonged expiration, respiratory distress or retractions.     Comments: Lungs clear to auscultation without  adventitious lung sounds. Musculoskeletal:     Cervical back: Normal range of motion and neck supple.  Neurological:     General: No focal deficit present.     Mental Status: She is alert and oriented to person, place, and time.      UC Treatments / Results  Labs (all labs ordered are listed, but only abnormal results are displayed) Labs Reviewed  SARS CORONAVIRUS 2 (TAT 6-24 HRS)  CULTURE, GROUP A STREP [facility])  POCT RAPID STREP A, ED / UC    EKG   Radiology No results found.  Procedures Procedures (including critical care time)  Medications Ordered in UC Medications - No data to display  Initial Impression / Assessment and Plan / UC Course  I have reviewed the triage vital signs and the nursing notes.  Pertinent labs & imaging results that were available during my care of the patient were reviewed by me and considered in my medical decision making (see chart for details).    COVID PCR test ordered. Patient to quarantine until testing results return. No alarming signs on exam. LCTAB. Symptomatic treatment discussed.  Push fluids.  Return precautions given.  Patient expresses understanding and agrees to plan.  Final Clinical Impressions(s) / UC Diagnoses   Final diagnoses:  Viral URI    ED Prescriptions    Medication Sig Dispense Auth. Provider   benzonatate (TESSALON) 200 MG capsule Take 1 capsule (200 mg total) by mouth every 8 (eight) hours. 21 capsule Siyana Erney V, PA-C   lidocaine (XYLOCAINE) 2 % solution 5-15 mL gargle as needed 150 mL Belinda Fisher, PA-C     PDMP not reviewed this encounter.   Belinda Fisher, PA-C 05/20/21 2032

## 2021-05-20 NOTE — Discharge Instructions (Addendum)
COVID PCR testing ordered. I would like you to quarantine until testing results.   Tessalon for cough Start lidocaine for sore throat, do not eat or drink for the next 40 mins after use as it can stunt your gag reflex. You can take over the counter flonase/nasacort to help with nasal congestion/drainage.  Tylenol/motrin for pain and fever.  Keep hydrated, urine should be clear to pale yellow in color.   If experiencing shortness of breath, trouble breathing, go to the emergency department for further evaluation needed.

## 2021-05-20 NOTE — ED Triage Notes (Signed)
Pt present coughing with sore throat. Symptom started yesterday. Pt states it hurts to swallow.

## 2021-05-21 LAB — SARS CORONAVIRUS 2 (TAT 6-24 HRS): SARS Coronavirus 2: NEGATIVE

## 2021-05-23 LAB — CULTURE, GROUP A STREP (THRC)

## 2021-06-10 DIAGNOSIS — Z3042 Encounter for surveillance of injectable contraceptive: Secondary | ICD-10-CM | POA: Diagnosis not present

## 2021-07-17 ENCOUNTER — Ambulatory Visit (HOSPITAL_COMMUNITY): Payer: Self-pay | Admitting: Clinical

## 2021-08-09 ENCOUNTER — Ambulatory Visit (INDEPENDENT_AMBULATORY_CARE_PROVIDER_SITE_OTHER): Payer: Medicaid Other | Admitting: Clinical

## 2021-08-09 ENCOUNTER — Other Ambulatory Visit: Payer: Self-pay

## 2021-08-09 DIAGNOSIS — F4310 Post-traumatic stress disorder, unspecified: Secondary | ICD-10-CM

## 2021-08-09 DIAGNOSIS — F330 Major depressive disorder, recurrent, mild: Secondary | ICD-10-CM
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
## 2021-08-21 DIAGNOSIS — Z0000 Encounter for general adult medical examination without abnormal findings: Secondary | ICD-10-CM | POA: Diagnosis not present

## 2021-09-09 DIAGNOSIS — Z3042 Encounter for surveillance of injectable contraceptive: Secondary | ICD-10-CM | POA: Diagnosis not present

## 2021-09-16 DIAGNOSIS — Z029 Encounter for administrative examinations, unspecified: Secondary | ICD-10-CM | POA: Diagnosis not present

## 2021-09-16 DIAGNOSIS — Z111 Encounter for screening for respiratory tuberculosis: Secondary | ICD-10-CM | POA: Diagnosis not present

## 2021-10-04 ENCOUNTER — Ambulatory Visit (HOSPITAL_COMMUNITY): Payer: Medicaid Other | Admitting: Clinical

## 2021-10-07 DIAGNOSIS — S29012A Strain of muscle and tendon of back wall of thorax, initial encounter: Secondary | ICD-10-CM | POA: Diagnosis not present

## 2021-10-25 ENCOUNTER — Encounter (HOSPITAL_COMMUNITY): Payer: Self-pay | Admitting: Psychiatry

## 2021-10-25 ENCOUNTER — Ambulatory Visit (INDEPENDENT_AMBULATORY_CARE_PROVIDER_SITE_OTHER): Payer: Medicaid Other | Admitting: Psychiatry

## 2021-10-25 DIAGNOSIS — F411 Generalized anxiety disorder: Secondary | ICD-10-CM | POA: Diagnosis not present

## 2021-10-25 DIAGNOSIS — F330 Major depressive disorder, recurrent, mild: Secondary | ICD-10-CM

## 2021-10-25 DIAGNOSIS — F4310 Post-traumatic stress disorder, unspecified: Secondary | ICD-10-CM

## 2021-10-25 MED ORDER — PRAZOSIN HCL 1 MG PO CAPS
1.0000 mg | ORAL_CAPSULE | Freq: Every day | ORAL | 3 refills | Status: DC
Start: 2021-10-25 — End: 2022-01-22

## 2021-10-25 MED ORDER — HYDROXYZINE HCL 10 MG PO TABS: 10.0000 mg | ORAL_TABLET | Freq: Three times a day (TID) | ORAL | 3 refills | Status: DC | PRN

## 2021-10-25 MED ORDER — TRAZODONE HCL 50 MG PO TABS: 25.0000 mg | ORAL_TABLET | Freq: Every day | ORAL | 3 refills | Status: DC
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
## 2021-11-01 ENCOUNTER — Ambulatory Visit (INDEPENDENT_AMBULATORY_CARE_PROVIDER_SITE_OTHER): Payer: Medicaid Other | Admitting: Clinical

## 2021-11-01 DIAGNOSIS — F4310 Post-traumatic stress disorder, unspecified: Secondary | ICD-10-CM | POA: Diagnosis not present
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
## 2021-11-04 DIAGNOSIS — J029 Acute pharyngitis, unspecified: Secondary | ICD-10-CM | POA: Diagnosis not present

## 2021-11-07 DIAGNOSIS — Z1388 Encounter for screening for disorder due to exposure to contaminants: Secondary | ICD-10-CM | POA: Diagnosis not present

## 2021-11-07 DIAGNOSIS — Z0389 Encounter for observation for other suspected diseases and conditions ruled out: Secondary | ICD-10-CM | POA: Diagnosis not present

## 2021-11-07 DIAGNOSIS — Z3009 Encounter for other general counseling and advice on contraception: Secondary | ICD-10-CM | POA: Diagnosis not present

## 2021-11-26 DIAGNOSIS — N939 Abnormal uterine and vaginal bleeding, unspecified: Secondary | ICD-10-CM | POA: Diagnosis not present

## 2021-11-28 DIAGNOSIS — Z419 Encounter for procedure for purposes other than remedying health state, unspecified: Secondary | ICD-10-CM | POA: Diagnosis not present

## 2021-12-02 DIAGNOSIS — Z3042 Encounter for surveillance of injectable contraceptive: Secondary | ICD-10-CM | POA: Diagnosis not present

## 2021-12-18 ENCOUNTER — Encounter (HOSPITAL_COMMUNITY): Payer: Self-pay

## 2021-12-18 ENCOUNTER — Ambulatory Visit (HOSPITAL_COMMUNITY): Payer: Medicaid Other | Admitting: Clinical

## 2021-12-26 ENCOUNTER — Ambulatory Visit: Payer: Medicaid Other | Attending: Internal Medicine

## 2021-12-26 ENCOUNTER — Other Ambulatory Visit (HOSPITAL_BASED_OUTPATIENT_CLINIC_OR_DEPARTMENT_OTHER): Payer: Self-pay

## 2021-12-26 DIAGNOSIS — Z23 Encounter for immunization: Secondary | ICD-10-CM

## 2021-12-26 MED ORDER — PFIZER COVID-19 VAC BIVALENT 30 MCG/0.3ML IM SUSP
INTRAMUSCULAR | 0 refills | Status: DC
  Filled 2021-12-26: qty 0.3, 1d supply, fill #0

## 2021-12-26 NOTE — Progress Notes (Signed)
° °  Covid-19 Vaccination Clinic  Name:  Nancy Mendoza    MRN: [id number] DOB: [birth year]  12/26/2021  [name] was observed post Covid-19 immunization for 15 minutes without incident. She was provided with Vaccine Information Sheet and instruction to access the V-Safe system.   [name] was instructed to call 911 with any severe reactions post vaccine: Difficulty breathing  Swelling of face and throat  A fast heartbeat  A bad rash all over body  Dizziness and weakness   Immunizations Administered     Name Date Dose VIS Date Route   Pfizer Covid-19 Vaccine Bivalent Booster 12/26/2021 11:17 AM 0.3 mL 08/28/2021 Intramuscular   Manufacturer: [employer], [employer]   Lot: [id number]   NDC: [phone]

## 2021-12-29 DIAGNOSIS — Z419 Encounter for procedure for purposes other than remedying health state, unspecified: Secondary | ICD-10-CM | POA: Diagnosis not present

## 2022-01-22 ENCOUNTER — Encounter (HOSPITAL_COMMUNITY): Payer: Self-pay | Admitting: Psychiatry

## 2022-01-22 ENCOUNTER — Telehealth (INDEPENDENT_AMBULATORY_CARE_PROVIDER_SITE_OTHER): Payer: Medicaid Other | Admitting: Psychiatry

## 2022-01-22 DIAGNOSIS — F4310 Post-traumatic stress disorder, unspecified: Secondary | ICD-10-CM

## 2022-01-22 DIAGNOSIS — F411 Generalized anxiety disorder: Secondary | ICD-10-CM | POA: Diagnosis not present

## 2022-01-22 DIAGNOSIS — F330 Major depressive disorder, recurrent, mild: Secondary | ICD-10-CM

## 2022-01-22 MED ORDER — HYDROXYZINE HCL 10 MG PO TABS: 10.0000 mg | ORAL_TABLET | Freq: Three times a day (TID) | ORAL | 3 refills | Status: DC | PRN

## 2022-01-22 MED ORDER — TRAZODONE HCL 150 MG PO TABS: 150.0000 mg | ORAL_TABLET | Freq: Every day | ORAL | 3 refills | Status: DC

## 2022-01-22 MED ORDER — PRAZOSIN HCL 2 MG PO CAPS: 2.0000 mg | ORAL_CAPSULE | Freq: Every day | ORAL | 3 refills | Status: DC
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
## 2022-01-29 DIAGNOSIS — Z124 Encounter for screening for malignant neoplasm of cervix: Secondary | ICD-10-CM | POA: Diagnosis not present

## 2022-01-29 DIAGNOSIS — N760 Acute vaginitis: Secondary | ICD-10-CM | POA: Diagnosis not present

## 2022-01-29 DIAGNOSIS — Z419 Encounter for procedure for purposes other than remedying health state, unspecified: Secondary | ICD-10-CM | POA: Diagnosis not present

## 2022-01-29 DIAGNOSIS — Z0000 Encounter for general adult medical examination without abnormal findings: Secondary | ICD-10-CM | POA: Diagnosis not present

## 2022-01-29 DIAGNOSIS — Z01419 Encounter for gynecological examination (general) (routine) without abnormal findings: Secondary | ICD-10-CM | POA: Diagnosis not present

## 2022-02-26 DIAGNOSIS — Z419 Encounter for procedure for purposes other than remedying health state, unspecified: Secondary | ICD-10-CM | POA: Diagnosis not present

## 2022-02-28 ENCOUNTER — Encounter (HOSPITAL_COMMUNITY): Payer: Self-pay

## 2022-02-28 ENCOUNTER — Ambulatory Visit (HOSPITAL_COMMUNITY): Payer: Medicaid Other | Admitting: Clinical

## 2022-03-19 ENCOUNTER — Encounter (HOSPITAL_COMMUNITY): Payer: Self-pay | Admitting: Emergency Medicine

## 2022-03-29 DIAGNOSIS — Z419 Encounter for procedure for purposes other than remedying health state, unspecified: Secondary | ICD-10-CM | POA: Diagnosis not present

## 2022-04-07 ENCOUNTER — Telehealth (INDEPENDENT_AMBULATORY_CARE_PROVIDER_SITE_OTHER): Payer: Medicaid Other | Admitting: Psychiatry

## 2022-04-07 ENCOUNTER — Telehealth (HOSPITAL_COMMUNITY): Payer: Medicaid Other | Admitting: Psychiatry

## 2022-04-07 DIAGNOSIS — F4310 Post-traumatic stress disorder, unspecified: Secondary | ICD-10-CM | POA: Diagnosis not present

## 2022-04-07 DIAGNOSIS — F330 Major depressive disorder, recurrent, mild: Secondary | ICD-10-CM | POA: Diagnosis not present

## 2022-04-07 DIAGNOSIS — F411 Generalized anxiety disorder: Secondary | ICD-10-CM

## 2022-04-07 MED ORDER — HYDROXYZINE HCL 10 MG PO TABS: 10.0000 mg | ORAL_TABLET | Freq: Three times a day (TID) | ORAL | 3 refills | Status: DC | PRN

## 2022-04-07 MED ORDER — PRAZOSIN HCL 2 MG PO CAPS: 2.0000 mg | ORAL_CAPSULE | Freq: Every day | ORAL | 3 refills | Status: DC

## 2022-04-07 MED ORDER — TRAZODONE HCL 150 MG PO TABS: 150.0000 mg | ORAL_TABLET | Freq: Every day | ORAL | 3 refills | Status: DC

## 2022-04-07 NOTE — Progress Notes (Signed)
BH MD/PA/NP OP Progress Note ? ?04/07/2022 6:03 PM ?[name]  ?MRN:  [id number] ? ?Virtual Visit via Video Note ? ?I connected with Nancy Mendoza on 04/07/22 at  4:30 PM EDT by a video enabled telemedicine application and verified that I am speaking with the correct person using two identifiers. ? ?Location: ?Patient: Home ?Provider: Offsite ?  ?I discussed the limitations of evaluation and management by telemedicine and the availability of in person appointments. The patient expressed understanding and agreed to proceed. ? ?  ?I discussed the assessment and treatment plan with the patient. The patient was provided an opportunity to ask questions and all were answered. The patient agreed with the plan and demonstrated an understanding of the instructions. ?  ?The patient was advised to call back or seek an in-person evaluation if the symptoms worsen or if the condition fails to improve as anticipated. ? ?I provided 10 minutes of non-face-to-face time during this encounter. ? ? ?Mcneil Sober, NP  ? ?Chief Complaint: Medication management ? ?HPI: Nancy Mendoza is a 31 year old female presenting to [facility] behavioral health outpatient for follow-up psychiatric evaluation. ?She has a psychiatric history of generalized anxiety disorder and PTSD.  Patient symptoms are managed with hydroxyzine 10 mg 3 times daily as needed for anxiety, prazosin 2 mg daily at bedtime and trazodone 150 mg at bedtime.  Patient reports medication compliance and confirms that medications are effective with managing her symptoms.  Patient denies adverse effects or the need for dosage adjustment today.  No medication changes today.  Medications refilled at current dosages. ? ?Patient is alert and oriented x4, calm, pleasant and willing to engage.  She appears well-groomed and dressed appropriately for the weather.  Patient reports good mood and appetite.  Patient reports that her sleep is disturbed at times, but her nightmares  have improved.  Patient does report life stressors, stating that she has a court date coming up soon.  Patient is open to initiating individual psychotherapy for additional symptom management during this time.  Patient denies suicidal or homicidal ideations, paranoia, delusional thought, auditory or visual hallucinations. ? ? ? ? ?Visit Diagnosis:  ?  ICD-10-CM   ?1. Generalized anxiety disorder  F41.1 hydrOXYzine (ATARAX) 10 MG tablet  ?  traZODone (DESYREL) 150 MG tablet  ?  ?2. PTSD (post-traumatic stress disorder)  F43.10 prazosin (MINIPRESS) 2 MG capsule  ?  ?3. MDD (major depressive disorder), recurrent episode, mild (HCC)  F33.0 traZODone (DESYREL) 150 MG tablet  ?  ? ? ?Past Psychiatric History: Generalized anxiety disorder and PTSD. ? ?Past Medical History:  ?Past Medical History:  ?Diagnosis Date  ? Anemia   ? History of postpartum hemorrhage   ?  ?Past Surgical History:  ?Procedure Laterality Date  ? CESAREAN SECTION    ? CESAREAN SECTION N/A 02/19/2021  ? Procedure: REPEAT CESAREAN SECTION;  Surgeon: Carrington Clamp, MD;  Location: [facility] LD [facility]S;  Service: Obstetrics;  Laterality: N/A;  ? DILATION AND CURETTAGE OF UTERUS    ? ? ?Family Psychiatric History: None known ? ?Family History:  ?Family History  ?Problem Relation Age of Onset  ? COPD Mother   ? Hypertension Mother   ? Healthy Father   ? Sleep apnea Father   ? Hypertension Father   ? ? ?Social History:  ?Social History  ? ?Socioeconomic History  ? Marital status: Divorced  ?  Spouse name: Not on file  ? Number of children: Not on file  ? Years of education:  Not on file  ? Highest education level: Not on file  ?Occupational History  ? Not on file  ?Tobacco Use  ? Smoking status: Never  ? Smokeless tobacco: Never  ?Substance and Sexual Activity  ? Alcohol use: Never  ? Drug use: No  ? Sexual activity: Yes  ?Other Topics Concern  ? Not on file  ?Social History Narrative  ? Not on file  ? ?Social Determinants of Health  ? ?Financial Resource Strain: Not  on file  ?Food Insecurity: Not on file  ?Transportation Needs: Not on file  ?Physical Activity: Not on file  ?Stress: Not on file  ?Social Connections: Not on file  ? ? ?Allergies: No Known Allergies ? ?Metabolic Disorder Labs: ?No results found for: HGBA1C, MPG ?No results found for: PROLACTIN ?No results found for: CHOL, TRIG, HDL, CHOLHDL, VLDL, LDLCALC ?No results found for: TSH ? ?Therapeutic Level Labs: ?No results found for: LITHIUM ?No results found for: VALPROATE ?No components found for:  CBMZ ? ?Current Medications: ?Current Outpatient Medications  ?Medication Sig Dispense Refill  ? benzonatate (TESSALON) 200 MG capsule Take 1 capsule (200 mg total) by mouth every 8 (eight) hours. 21 capsule 0  ? COVID-19 mRNA bivalent vaccine, Pfizer, (PFIZER COVID-19 VAC BIVALENT) injection Inject into the muscle. 0.3 mL 0  ? hydrOXYzine (ATARAX) 10 MG tablet Take 1 tablet (10 mg total) by mouth 3 (three) times daily as needed. 90 tablet 3  ? ibuprofen (ADVIL) 600 MG tablet Take 1 tablet (600 mg total) by mouth every 6 (six) hours as needed. 90 tablet 0  ? lidocaine (XYLOCAINE) 2 % solution 5-15 mL gargle as needed 150 mL 0  ? ondansetron (ZOFRAN-ODT) 8 MG disintegrating tablet Take 8 mg by mouth 2 (two) times daily as needed for heartburn.    ? oxyCODONE (ROXICODONE) 5 MG immediate release tablet Take 1 tablet (5 mg total) by mouth every 4 (four) hours as needed for severe pain. 16 tablet 0  ? prazosin (MINIPRESS) 2 MG capsule Take 1 capsule (2 mg total) by mouth at bedtime. 30 capsule 3  ? traZODone (DESYREL) 150 MG tablet Take 1 tablet (150 mg total) by mouth at bedtime. 30 tablet 3  ? ?No current facility-administered medications for this visit.  ? ? ? ?Musculoskeletal: ?Strength & Muscle Tone: N/A virtual visit ?Gait & Station: N/A virtual visit ?Patient leans: N/A ? ?Psychiatric Specialty Exam: ?Review of Systems  ?Psychiatric/Behavioral:  Negative for hallucinations, self-injury and suicidal ideas.   ?All other  systems reviewed and are negative.  ?unknown if currently breastfeeding.There is no height or weight on file to calculate BMI.  ?General Appearance: Well Groomed  ?Eye Contact:  Good  ?Speech:  Clear and Coherent  ?Volume:  Normal  ?Mood:  Euthymic  ?Affect:  Congruent  ?Thought Process:  Goal Directed  ?Orientation:  Full (Time, Place, and Person)  ?Thought Content: Logical   ?Suicidal Thoughts:  No  ?Homicidal Thoughts:  No  ?Memory: Good  ?Judgement:  Good  ?Insight:  Good  ?Psychomotor Activity:  NA  ?Concentration: Good  ?Recall:  Good  ?Fund of Knowledge: Good  ?Language: Good  ?Akathisia:  NA  ?Handed:  Right  ?AIMS (if indicated): not done  ?Assets:  Communication Skills  ?ADL's:  Intact  ?Cognition: WNL  ?Sleep:  Fair  ? ?Screenings: ?GAD-7   ? ?[facility] Row [facility] from 08/09/2021  in [facility]  ?Total GAD-7 Score 18 14 21   ? ?  ? ?PHQ2-9   ? ?Flowsheet Row Video Visit from 01/22/2022 in [facility] Office Visit from 10/25/2021 in [facility] Counselor from 08/09/2021 in [facility]  ?PHQ-2 Total Score 4 3 2   ?PHQ-9 Total Score 19 11 17   ? ?  ? ?Flowsheet Row Video Visit from 01/22/2022 in [facility] Counselor from 08/09/2021 in [facility] Admission (Discharged) from 02/19/2021 in [facility]  ?C-SSRS RISK CATEGORY Error: Q7 should not be populated when Q6 is No No Risk No Risk  ? ?  ? ? ? ?Assessment and Plan: Nancy Mendoza is a 31 year old female presenting to [facility] behavioral health outpatient for follow-up psychiatric evaluation. ?She has a psychiatric history of generalized anxiety disorder and PTSD.  Patient symptoms are managed with hydroxyzine 10 mg 3 times daily as  needed for anxiety, prazosin 2 mg daily at bedtime and trazodone 150 mg at bedtime.  Patient reports medication compliance and confirms that medications are effective with managing her symptoms.  Pati

## 2022-04-16 ENCOUNTER — Encounter: Payer: Self-pay | Admitting: Pulmonary Disease

## 2022-04-16 ENCOUNTER — Ambulatory Visit (INDEPENDENT_AMBULATORY_CARE_PROVIDER_SITE_OTHER): Payer: Medicaid Other

## 2022-04-16 ENCOUNTER — Ambulatory Visit (INDEPENDENT_AMBULATORY_CARE_PROVIDER_SITE_OTHER): Payer: Medicaid Other | Admitting: Pulmonary Disease

## 2022-04-16 VITALS — BP 124/68 | HR 87 | Temp 98.7°F | Ht 64.0 in | Wt 190.0 lb

## 2022-04-16 DIAGNOSIS — J4540 Moderate persistent asthma, uncomplicated: Secondary | ICD-10-CM

## 2022-04-16 DIAGNOSIS — R0600 Dyspnea, unspecified: Secondary | ICD-10-CM | POA: Diagnosis not present

## 2022-04-16 LAB — CBC WITH DIFFERENTIAL/PLATELET
Basophils Absolute: 0.1 10*3/uL (ref 0.0–0.1)
Basophils Relative: 0.5 % (ref 0.0–3.0)
Eosinophils Absolute: 0.1 10*3/uL (ref 0.0–0.7)
Eosinophils Relative: 0.9 % (ref 0.0–5.0)
HCT: 41.2 % (ref 36.0–46.0)
Hemoglobin: 13.5 g/dL (ref 12.0–15.0)
Lymphocytes Relative: 22.1 % (ref 12.0–46.0)
Lymphs Abs: 2.5 10*3/uL (ref 0.7–4.0)
MCHC: 32.9 g/dL (ref 30.0–36.0)
MCV: 86.6 fl (ref 78.0–100.0)
Monocytes Absolute: 0.7 10*3/uL (ref 0.1–1.0)
Monocytes Relative: 6.5 % (ref 3.0–12.0)
Neutro Abs: 7.8 10*3/uL — ABNORMAL HIGH (ref 1.4–7.7)
Neutrophils Relative %: 70 % (ref 43.0–77.0)
Platelets: 263 10*3/uL (ref 150.0–400.0)
RBC: 4.76 Mil/uL (ref 3.87–5.11)
RDW: 13.6 % (ref 11.5–15.5)
WBC: 11.1 10*3/uL — ABNORMAL HIGH (ref 4.0–10.5)

## 2022-04-16 MED ORDER — BUDESONIDE-FORMOTEROL FUMARATE 160-4.5 MCG/ACT IN AERO: 2.0000 | INHALATION_SPRAY | Freq: Two times a day (BID) | RESPIRATORY_TRACT | 6 refills | Status: DC

## 2022-04-16 MED ORDER — SPACER/AERO-HOLDING CHAMBERS DEVI: 2.0000 | 2 refills | Status: AC | PRN

## 2022-04-16 NOTE — Progress Notes (Signed)
No anemia - good news

## 2022-04-16 NOTE — Progress Notes (Signed)
? ?_0  ID: Nancy Mendoza, female    DOB: 18-Mar-1991, 31 y.o.   MRN: [id number] ? ?Chief Complaint  ?Patient presents with  ? Consult  ?  Consult for asthma. Out grew it as a child but it reappeared last year. Pt is currently on inhaler treatments, but she states with physical activity the inhalers are not working.   ? ? ?Referring provider: ?Everardo Beals, NP ? ?HPI:  ? ?31 y.o. with history asthma whom we have seen in consultation for evaluation of dyspnea on exertion.  Note from referring provider reviewed. ? ?Diagnosed with asthma as a child.  Got better.  Over the years.  Worsening dyspnea over the last 6 to 8 months.  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  For this not really any issues with breathing.  Use albuterol prior to work without noticeable improvement in breathing.  No other time of day when things are better or worse.  No position make things better or worse.  Does have seasonal allergies but unsure if seasonal changes are making things better or worse.  No other alleviating or exacerbating factors.  Never used maintenance inhaler.  No recent chest imaging. ? ?Review of labs indicates history of anemia, last CBC 01/2021.  History of fluctuant anemia in the past but largely centers around times of her cesarean section, birth of children. ? ?PMH: Asthma, seasonal allergies ?Surgical history: C-section x3 ?Family history: COPD hypertension in mother, hypertension sleep apnea father ?Social history: Never smoker, lives in [place], works at [employer] lab ? ? ?Questionaires / Pulmonary Flowsheets:  ? ?ACT:  ?Asthma Control Test ACT Total Score  ?04/16/2022 ? 2:03 PM 9  ? ? ?MMRC: ?   ? View : No data to display.  ?  ?  ?  ? ? ?Epworth:  ?   ? View : No data to display.  ?  ?  ?  ? ? ?Tests:  ? ?FENO:  ?No results found for: NITRICOXIDE ? ?PFT: ?   ? View : No data to display.  ?  ?  ?  ? ? ?WALK:  ?   ? View : No data to display.  ?  ?  ?   ? ? ?Imaging: ? ? ?Lab Results: ?Personally reviewed ?CBC ?   ?Component Value Date/Time  ? WBC 16.2 (H) 02/20/2021 0452  ? RBC 3.13 (L) 02/20/2021 0452  ? HGB 8.2 (L) 02/20/2021 0452  ? HCT 27.3 (L) 02/20/2021 0452  ? PLT 267 02/20/2021 0452  ? MCV 87.2 02/20/2021 0452  ? MCH 26.2 02/20/2021 0452  ? MCHC 30.0 02/20/2021 0452  ? RDW 15.3 02/20/2021 0452  ? LYMPHSABS 2.2 08/28/2018 0351  ? MONOABS 0.7 08/28/2018 0351  ? EOSABS 0.2 08/28/2018 0351  ? BASOSABS 0.0 08/28/2018 0351  ? ? ?BMET ?   ?Component Value Date/Time  ? NA 139 08/28/2018 0351  ? K 4.1 08/28/2018 0351  ? CL 104 08/28/2018 0351  ? CO2 23 08/28/2018 0351  ? GLUCOSE 86 08/28/2018 0351  ? BUN 13 08/28/2018 0351  ? CREATININE 0.72 08/28/2018 0351  ? CALCIUM 8.5 (L) 08/28/2018 0351  ? GFRNONAA >60 08/28/2018 0351  ? GFRAA >60 08/28/2018 0351  ? ? ?BNP ?No results found for: BNP ? ?ProBNP ?No results found for: PROBNP ? ?Specialty Problems   ?None ? ? ?Allergies  ?Allergen Reactions  ? Pollen Extract Other (See Comments)  ? ? ?Immunization History  ?  Administered Date(s) Administered  ? Hepatitis B, ped/adol 1991[birth year]02[birth year], 02/27/1992, 07/10/1992  ? Influenza,inj,Quad PF,6+ Mos 02/26/2019  ? Influenza,inj,quad, With Preservative 10/09/2016, 09/22/2020  ? Influenza-Unspecified 09/22/2020, 09/20/2021  ? MMR 03/11/1993  ? PFIZER(Purple Top)SARS-COV-2 Vaccination 01/07/2020, 01/28/2020, 10/26/2020  ? Pension scheme manager 75yr & up 12/26/2021  ? Tdap 12/14/2020  ? ? ?Past Medical History:  ?Diagnosis Date  ? Anemia   ? History of postpartum hemorrhage   ? ? ?Tobacco History: ?Social History  ? ?Tobacco Use  ?Smoking Status Never  ?Smokeless Tobacco Never  ? ?Counseling given: Not Answered ? ? ?Continue to not smoke ? ?Outpatient Encounter Medications as of 04/16/2022  ?Medication Sig  ? albuterol (VENTOLIN HFA) 108 (90 Base) MCG/ACT inhaler ProAir HFA 90 mcg/actuation aerosol inhaler ? INHALE 2 PUFFS BY MOUTH EVERY 4 HOURS AS NEEDED  ?  budesonide-formoterol (SYMBICORT) 160-4.5 MCG/ACT inhaler Inhale 2 puffs into the lungs in the morning and at bedtime.  ? hydrOXYzine (ATARAX) 10 MG tablet Take 1 tablet (10 mg total) by mouth 3 (three) times daily as needed.  ? ondansetron (ZOFRAN-ODT) 8 MG disintegrating tablet Take 8 mg by mouth 2 (two) times daily as needed for heartburn.  ? prazosin (MINIPRESS) 2 MG capsule Take 1 capsule (2 mg total) by mouth at bedtime.  ? Spacer/Aero-Holding Chambers DEVI 2 puffs by Does not apply route as needed.  ? traZODone (DESYREL) 150 MG tablet Take 1 tablet (150 mg total) by mouth at bedtime.  ? [DISCONTINUED] COVID-19 mRNA bivalent vaccine, Pfizer, (PFIZER COVID-19 VAC BIVALENT) injection Inject into the muscle.  ? [DISCONTINUED] benzonatate (TESSALON) 200 MG capsule Take 1 capsule (200 mg total) by mouth every 8 (eight) hours.  ? [DISCONTINUED] ibuprofen (ADVIL) 600 MG tablet Take 1 tablet (600 mg total) by mouth every 6 (six) hours as needed.  ? [DISCONTINUED] lidocaine (XYLOCAINE) 2 % solution 5-15 mL gargle as needed  ? [DISCONTINUED] oxyCODONE (ROXICODONE) 5 MG immediate release tablet Take 1 tablet (5 mg total) by mouth every 4 (four) hours as needed for severe pain.  ? ?No facility-administered encounter medications on file as of 04/16/2022.  ? ? ? ?Review of Systems ? ?Review of Systems  ?No chest pain with exertion.  No orthopnea or PND.  Comprehensive review of systems otherwise negative. ?Physical Exam ? ?BP 124/68 (BP Location: Left Arm, Patient Position: Sitting, Cuff Size: Normal)   Pulse 87   Temp 98.7 ?F (37.1 ?C) (Oral)   Ht _0  (1.626 m)   Wt 190 lb (86.2 kg)   SpO2 99%   BMI 32.61 kg/m?  ? ?Wt Readings from Last 5 Encounters:  ?04/16/22 190 lb (86.2 kg)  ?02/19/21 190 lb (86.2 kg)  ?02/11/21 190 lb (86.2 kg)  ? ? ?BMI Readings from Last 5 Encounters:  ?04/16/22 32.61 kg/m?  ?02/19/21 32.61 kg/m?  ?02/11/21 32.61 kg/m?  ? ? ? ?Physical Exam ?General: Well-appearing, no acute distress ?Eyes:  EOMI, icterus ?Neck: Supple no JVP ?Pulmonary: Clear, normal work of breathing. ?Cardiovascular: Regular rate and rhythm, no murmur ?Abdomen: Nontender, soft present ?MSK: No synovitis, no effusion ?Neuro: Normal gait, no weakness ?Psych: Normal full affect ? ?Assessment & Plan:  ? ?Dyspnea on exertion: Likely multifactorial related to deconditioning, high concern for uncontrolled asthma.  Worsened with new work environment. Chest x-ray, PFTs for further evaluation.  Notably, history of anemia, check CBC today.  See please see below for asthma escalation. ? ?Asthma: Moderate persistent.  No improvement with albuterol.  Childhood diagnosis.  Worsening dyspnea after changing jobs, new work environment.  Start Symbicort high-dose for maintenance inhaler.  Continue albuterol as needed.  Spacer provided.  Inhaler education given. ? ? ?Return in about 6 weeks (around 05/28/2022). ? ? ?Lanier Clam, MD ?04/16/2022 ? ? ?This appointment required 62 minutes of patient care (this includes precharting, chart review, review of results, face-to-face care, etc.). ? ?

## 2022-04-16 NOTE — Patient Instructions (Signed)
Nice to meet you ? ?We are going to use a new inhaler, Symbicort 2 puffs twice a day every day.  Rinse your mouth out after every use.  Use with a spacer. ? ?Continues albuterol as needed, 2 puffs every 4-6 hours as needed.  Use with a spacer. ? ?We will get a chest x-ray and labs today.  I want to check on your anemia from last year, hopefully better. ? ?We will get pulmonary function test may come back for your follow-up visit in a few weeks ? ?Return to clinic in 6 weeks with Dr. Judeth Horn with pulmonary function test prior to visit same day ?

## 2022-04-19 NOTE — Progress Notes (Signed)
CXR is clear

## 2022-04-22 ENCOUNTER — Telehealth: Payer: Self-pay | Admitting: Pulmonary Disease

## 2022-04-22 NOTE — Telephone Encounter (Signed)
Have obtained a spacer for pt that is at no cost for her due to [place] getting samples of these. This has been placed up front at the office for pt. Attempted to call pt to let her know this had been done but unable to reach. Left a detailed message for pt letting her know this was done. Nothing further needed. ?

## 2022-04-28 DIAGNOSIS — Z419 Encounter for procedure for purposes other than remedying health state, unspecified: Secondary | ICD-10-CM | POA: Diagnosis not present

## 2022-05-06 ENCOUNTER — Encounter (HOSPITAL_COMMUNITY): Payer: Self-pay

## 2022-05-06 ENCOUNTER — Ambulatory Visit (HOSPITAL_COMMUNITY): Payer: Medicaid Other | Admitting: Clinical

## 2022-05-20 ENCOUNTER — Other Ambulatory Visit (HOSPITAL_COMMUNITY): Payer: Self-pay | Admitting: Psychiatry

## 2022-05-20 DIAGNOSIS — F411 Generalized anxiety disorder: Secondary | ICD-10-CM

## 2022-05-20 DIAGNOSIS — F330 Major depressive disorder, recurrent, mild: Secondary | ICD-10-CM

## 2022-05-29 ENCOUNTER — Ambulatory Visit (INDEPENDENT_AMBULATORY_CARE_PROVIDER_SITE_OTHER): Payer: Medicaid Other | Admitting: Pulmonary Disease

## 2022-05-29 DIAGNOSIS — Z419 Encounter for procedure for purposes other than remedying health state, unspecified: Secondary | ICD-10-CM | POA: Diagnosis not present

## 2022-05-29 DIAGNOSIS — J4540 Moderate persistent asthma, uncomplicated: Secondary | ICD-10-CM | POA: Diagnosis not present

## 2022-05-29 LAB — PULMONARY FUNCTION TEST
DL/VA % pred: 121 %
DL/VA: 6.03 ml/min/mmHg/L
DLCO cor % pred: 170 %
DLCO cor: 26.4 ml/min/mmHg
DLCO unc % pred: 170 %
DLCO unc: 26.49 ml/min/mmHg
FEF 25-75 Post: 3.59 L/sec
FEF 25-75 Pre: 1.4 L/sec
FEF2575-%Change-Post: 155 %
FEF2575-%Pred-Post: 122 %
FEF2575-%Pred-Pre: 47 %
FEV1-%Change-Post: 83 %
FEV1-%Pred-Post: 137 %
FEV1-%Pred-Pre: 75 %
FEV1-Post: 3.19 L
FEV1-Pre: 1.74 L
FEV1FVC-%Change-Post: 61 %
FEV1FVC-%Pred-Pre: 63 %
FEV6-%Change-Post: 13 %
FEV6-%Pred-Post: 139 %
FEV6-%Pred-Pre: 122 %
FEV6-Post: 3.68 L
FEV6-Pre: 3.24 L
FEV6FVC-%Pred-Post: 99 %
FEV6FVC-%Pred-Pre: 99 %
FVC-%Change-Post: 13 %
FVC-%Pred-Post: 139 %
FVC-%Pred-Pre: 122 %
FVC-Post: 3.68 L
FVC-Pre: 3.24 L
Post FEV1/FVC ratio: 86 %
Post FEV6/FVC ratio: 100 %
Pre FEV1/FVC ratio: 54 %
Pre FEV6/FVC Ratio: 100 %
RV % pred: 78 %
RV: 0.7 L
TLC % pred: 117 %
TLC: 4.17 L

## 2022-05-29 NOTE — Progress Notes (Signed)
Full PFT Performed Today  

## 2022-05-29 NOTE — Patient Instructions (Signed)
Full PFT Performed Today  

## 2022-06-06 DIAGNOSIS — L719 Rosacea, unspecified: Secondary | ICD-10-CM | POA: Diagnosis not present

## 2022-06-06 DIAGNOSIS — F424 Excoriation (skin-picking) disorder: Secondary | ICD-10-CM | POA: Diagnosis not present

## 2022-06-06 DIAGNOSIS — L739 Follicular disorder, unspecified: Secondary | ICD-10-CM | POA: Diagnosis not present

## 2022-06-06 DIAGNOSIS — L905 Scar conditions and fibrosis of skin: Secondary | ICD-10-CM | POA: Diagnosis not present

## 2022-06-11 ENCOUNTER — Encounter: Payer: Self-pay | Admitting: Pulmonary Disease

## 2022-06-11 ENCOUNTER — Ambulatory Visit (INDEPENDENT_AMBULATORY_CARE_PROVIDER_SITE_OTHER): Payer: Medicaid Other | Admitting: Pulmonary Disease

## 2022-06-11 VITALS — BP 120/64 | HR 84 | Temp 98.2°F | Ht 64.0 in | Wt 197.6 lb

## 2022-06-11 DIAGNOSIS — J4540 Moderate persistent asthma, uncomplicated: Secondary | ICD-10-CM | POA: Diagnosis not present

## 2022-06-11 NOTE — Patient Instructions (Addendum)
Nice to see you   No changes to medications  Continue Symbicort 2 puffs twice a day every day, albuterol  as needed  Return to clinic in 3 months or sooner

## 2022-06-11 NOTE — Progress Notes (Signed)
$'@Patient's$  ID: Nancy Mendoza, female    DOB: 06-12-1991, 31 y.o.   MRN: [id number]  Chief Complaint  Patient presents with   Follow-up    Pt is here for asthma follow up. Pt states she is doing better since last visit. Pt is on Symbicort daily and albuterol as needed. Pt states that the Symbicort is doing well for her.     Referring provider: Everardo Beals, NP  HPI:   31 y.o. with history asthma whom we have seen in follow up for evaluation of dyspnea on exertion.    Last visit, started on high-dose Symbicort.  This is markedly improved her symptoms.  Dyspnea better.  Use albuterol once or twice a week.  Was worse couple weeks ago with the smoke from [place].  Pleased with control.  She is a bit apprehensive about returning to campus in the fall as a walk around campus has been giving her trouble in the weeks to months preceding.  Discussed trialing albuterol prior to riding in her car to campus.  She does well after a week she can back off to not doing this anymore.  PFTs obtained and reviewed and discussed with patient.  Interpreted as spirometry suggestive of mild obstruction that resolved with incredible 1.45 L improvement in FEV1 and resolution of obstruction with albuterol administration, TLC within normal limits, DLCO elevated, all consistent with likely asthma.  HPI at initial visit: Diagnosed with asthma as a child.  Got better.  Over the years.  Worsening dyspnea over the last 6 to 8 months.  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  For this not really any issues with breathing.  Use albuterol prior to work without noticeable improvement in breathing.  No other time of day when things are better or worse.  No position make things better or worse.  Does have seasonal allergies but unsure if seasonal changes are making things better or worse.  No other alleviating or exacerbating factors.  Never used maintenance inhaler.  No recent  chest imaging.  Review of labs indicates history of anemia, last CBC 01/2021.  History of fluctuant anemia in the past but largely centers around times of her cesarean section, birth of children.  PMH: Asthma, seasonal allergies Surgical history: C-section x3 Family history: COPD hypertension in mother, hypertension sleep apnea father Social history: Never smoker, lives in [place], works at [employer] / Pulmonary Flowsheets:   ACT:  Asthma Control Test ACT Total Score  06/11/2022  3:14 PM 21  04/16/2022  2:03 PM 9    MMRC:     No data to display           Epworth:      No data to display           Tests:   FENO:  No results found for: "NITRICOXIDE"  PFT:    Latest Ref Rng & Units 05/29/2022   11:56 AM  PFT Results  FVC-Pre L 3.24   FVC-Predicted Pre % 122   FVC-Post L 3.68   FVC-Predicted Post % 139   Pre FEV1/FVC % % 54   Post FEV1/FCV % % 86   FEV1-Pre L 1.74   FEV1-Predicted Pre % 75   FEV1-Post L 3.19   DLCO uncorrected ml/min/mmHg 26.49   DLCO UNC% % 170   DLCO corrected ml/min/mmHg 26.40   DLCO COR %Predicted % 170   DLVA Predicted % 121  TLC L 4.17   TLC % Predicted % 117   RV % Predicted % 78     WALK:      No data to display           Imaging:   Lab Results: Personally reviewed CBC    Component Value Date/Time   WBC 11.1 (H) 04/16/2022 1446   RBC 4.76 04/16/2022 1446   HGB 13.5 04/16/2022 1446   HCT 41.2 04/16/2022 1446   PLT 263.0 04/16/2022 1446   MCV 86.6 04/16/2022 1446   MCH 26.2 02/20/2021 0452   MCHC 32.9 04/16/2022 1446   RDW 13.6 04/16/2022 1446   LYMPHSABS 2.5 04/16/2022 1446   MONOABS 0.7 04/16/2022 1446   EOSABS 0.1 04/16/2022 1446   BASOSABS 0.1 04/16/2022 1446    BMET    Component Value Date/Time   NA 139 08/28/2018 0351   K 4.1 08/28/2018 0351   CL 104 08/28/2018 0351   CO2 23 08/28/2018 0351   GLUCOSE 86 08/28/2018 0351   BUN 13 08/28/2018 0351   CREATININE 0.72  08/28/2018 0351   CALCIUM 8.5 (L) 08/28/2018 0351   GFRNONAA >60 08/28/2018 0351   GFRAA >60 08/28/2018 0351    BNP No results found for: "BNP"  ProBNP No results found for: "PROBNP"  Specialty Problems   None   Allergies  Allergen Reactions   Pollen Extract Other (See Comments)    Immunization History  Administered Date(s) Administered   Hepatitis B, ped/adol 12/31/90, 02/27/1992, 07/10/1992   Influenza,inj,Quad PF,6+ Mos 02/26/2019   Influenza,inj,quad, With Preservative 10/09/2016, 09/22/2020   Influenza-Unspecified 09/22/2020, 09/20/2021   MMR 03/11/1993   PFIZER(Purple Top)SARS-COV-2 Vaccination 01/07/2020, 01/28/2020, 10/26/2020   Pfizer Covid-19 Vaccine Bivalent Booster 51yrs & up 12/26/2021   Tdap 12/14/2020    Past Medical History:  Diagnosis Date   Anemia    History of postpartum hemorrhage     Tobacco History: Social History   Tobacco Use  Smoking Status Never  Smokeless Tobacco Never   Counseling given: Not Answered   Continue to not smoke  Outpatient Encounter Medications as of 06/11/2022  Medication Sig   albuterol (VENTOLIN HFA) 108 (90 Base) MCG/ACT inhaler ProAir HFA 90 mcg/actuation aerosol inhaler  INHALE 2 PUFFS BY MOUTH EVERY 4 HOURS AS NEEDED   budesonide-formoterol (SYMBICORT) 160-4.5 MCG/ACT inhaler Inhale 2 puffs into the lungs in the morning and at bedtime.   doxycycline (VIBRAMYCIN) 100 MG capsule Take 100 mg by mouth daily.   hydrOXYzine (ATARAX) 10 MG tablet Take 1 tablet (10 mg total) by mouth 3 (three) times daily as needed.   ondansetron (ZOFRAN-ODT) 8 MG disintegrating tablet Take 8 mg by mouth 2 (two) times daily as needed for heartburn.   prazosin (MINIPRESS) 2 MG capsule Take 1 capsule (2 mg total) by mouth at bedtime.   Spacer/Aero-Holding Chambers DEVI 2 puffs by Does not apply route as needed.   SSD 1 % cream Apply topically 2 (two) times daily.   traZODone (DESYREL) 150 MG tablet TAKE 1 TABLET(150 MG) BY MOUTH AT  BEDTIME   triamcinolone cream (KENALOG) 0.1 % SMARTSIG:sparingly Topical Twice Daily   No facility-administered encounter medications on file as of 06/11/2022.     Review of Systems  Review of Systems  No chest pain with exertion.  No orthopnea or PND.  Comprehensive review of systems otherwise negative. Physical Exam  BP 120/64 (BP Location: Left Arm, Patient Position: Sitting, Cuff Size: Normal)   Pulse 84   Temp 98.2 F (36.8  C) (Oral)   Ht $R'5\' 4"'sy$  (1.626 m)   Wt 197 lb 9.6 oz (89.6 kg)   SpO2 98%   BMI 33.92 kg/m   Wt Readings from Last 5 Encounters:  06/11/22 197 lb 9.6 oz (89.6 kg)  04/16/22 190 lb (86.2 kg)  02/19/21 190 lb (86.2 kg)  02/11/21 190 lb (86.2 kg)    BMI Readings from Last 5 Encounters:  06/11/22 33.92 kg/m  04/16/22 32.61 kg/m  02/19/21 32.61 kg/m  02/11/21 32.61 kg/m     Physical Exam General: Well-appearing, no acute distress Eyes: EOMI, icterus Neck: Supple no JVP Pulmonary: Clear, normal work of breathing. Cardiovascular: Regular rate and rhythm, no murmur Abdomen: Nontender, soft present MSK: No synovitis, no effusion Neuro: Normal gait, no weakness Psych: Normal full affect  Assessment & Plan:   Dyspnea on exertion: Likely multifactorial related to deconditioning, high concern for uncontrolled asthma.  Worsened with new work environment.  PFTs consistent with asthma.  Symptoms improved with Symbicort.  Asthma: Moderate persistent.  No improvement with albuterol.  Childhood diagnosis.  Worsening dyspnea after changing jobs, new work environment.  Continue high-dose Symbicort with improvement in symptoms..  Continue albuterol as needed.   Return in about 3 months (around 09/11/2022).   Lanier Clam, MD 06/11/2022

## 2022-06-28 DIAGNOSIS — Z419 Encounter for procedure for purposes other than remedying health state, unspecified: Secondary | ICD-10-CM | POA: Diagnosis not present

## 2022-06-30 ENCOUNTER — Encounter (HOSPITAL_COMMUNITY): Payer: Self-pay | Admitting: Psychiatry

## 2022-06-30 ENCOUNTER — Telehealth (INDEPENDENT_AMBULATORY_CARE_PROVIDER_SITE_OTHER): Payer: Medicaid Other | Admitting: Psychiatry

## 2022-06-30 DIAGNOSIS — F330 Major depressive disorder, recurrent, mild: Secondary | ICD-10-CM

## 2022-06-30 DIAGNOSIS — F411 Generalized anxiety disorder: Secondary | ICD-10-CM | POA: Diagnosis not present

## 2022-06-30 DIAGNOSIS — F4310 Post-traumatic stress disorder, unspecified: Secondary | ICD-10-CM | POA: Diagnosis not present

## 2022-06-30 MED ORDER — PRAZOSIN HCL 2 MG PO CAPS: 2.0000 mg | ORAL_CAPSULE | Freq: Every day | ORAL | 3 refills | Status: DC

## 2022-06-30 MED ORDER — SERTRALINE HCL 25 MG PO TABS: 25.0000 mg | ORAL_TABLET | Freq: Every day | ORAL | 3 refills | Status: DC

## 2022-06-30 MED ORDER — TRAZODONE HCL 150 MG PO TABS: ORAL_TABLET | ORAL | 3 refills | Status: DC

## 2022-06-30 MED ORDER — HYDROXYZINE HCL 25 MG PO TABS: 25.0000 mg | ORAL_TABLET | Freq: Three times a day (TID) | ORAL | 3 refills | Status: DC | PRN

## 2022-06-30 MED ORDER — UNISOM SLEEPMELTS 25 MG PO TBDP: 25.0000 mg | ORAL_TABLET | Freq: Every evening | ORAL | 3 refills | Status: DC | PRN

## 2022-06-30 NOTE — Progress Notes (Signed)
BH MD/PA/NP OP Progress Note Virtual Visit via Video Note  I connected with Nancy Mendoza on 06/30/22 at  4:00 PM EDT by a video enabled telemedicine application and verified that I am speaking with the correct person using two identifiers.  Location: Patient: Home Provider: Clinic   I discussed the limitations of evaluation and management by telemedicine and the availability of in person appointments. The patient expressed understanding and agreed to proceed.  I provided 30 minutes of non-face-to-face time during this encounter.   06/30/2022 4:26 PM Nancy Mendoza  MRN:  [id number]  Chief Complaint: "I have been more depressed"  HPI: 31 year old female seen today for follow up psychiatric evaluation. She has a psychiatric history of anxiety, depression, and PTSD. Currently she managed on trazodone 150 mg nightly as needed, prazosin 2 mg nightly, and hydroxyzine 10 mg 3 times daily.  She notes her medications are somewhat effective for managing her psychiatric conditions.  Today patient was well groomed, pleasant, cooperative, engaged in conversation and maintained eye contact.  She informed Clinical research associate that she has been more depressed.  She reports that she lacks motivation to do things that she once enjoyed, is tired most days, irritable, and has poor sleep.  She notes that she sleeps approximately 4 hours nightly.  Patient notes that life stressors are exacerbating her anxiety and depression.  She informed Clinical research associate that her ex husband was held not guilty and his violation of his 38 B.  She also notes that she is not worried about her and her children safety.  Patient also reports that she is dealing with financial stressors.  She reports that her roof had a leak and cause water damage to her children's on room.  Today provider conducted a GAD-7 and patient scored a 19.  Provider also conducted a PHQ-9 and patient scored an 18.  She endorses increased appetite and weight gain.  Today she  denies SI/HI/VAH, mania, paranoia.   Patient informed writer that her current dose of prazosin is helping her nightmares.  Today she is agreeable to increasing hydroxyzine 10 mg 3 times daily to 25 mg 3 times daily to help manage anxiety and sleep.  She is also agreeable to starting Zoloft 25 mg daily to help manage anxiety and depression.  Patient agreeable to start Unisom 25 mg nightly as needed help manage sleep.  She will continue her other medication as prescribed.  Provider discussed starting Abilify at next visit if mood does not improve.  She endorsed understanding and agreed.  She will follow-up with outpatient counseling for therapy.  No other concerns at this time.      Visit Diagnosis:    ICD-10-CM   1. Generalized anxiety disorder  F41.1 traZODone (DESYREL) 150 MG tablet    hydrOXYzine (ATARAX) 25 MG tablet    sertraline (ZOLOFT) 25 MG tablet    2. MDD (major depressive disorder), recurrent episode, mild (HCC)  F33.0 traZODone (DESYREL) 150 MG tablet    sertraline (ZOLOFT) 25 MG tablet    diphenhydrAMINE HCl, Sleep, (UNISOM SLEEPMELTS) 25 MG TBDP    3. PTSD (post-traumatic stress disorder)  F43.10 prazosin (MINIPRESS) 2 MG capsule      Past Psychiatric History: Anxiety and PTSD  Past Medical History:  Past Medical History:  Diagnosis Date   Anemia    History of postpartum hemorrhage     Past Surgical History:  Procedure Laterality Date   CESAREAN SECTION     CESAREAN SECTION N/A 02/19/2021  Procedure: REPEAT CESAREAN SECTION;  Surgeon: Carrington Clamp, MD;  Location: [facility];  Service: Obstetrics;  Laterality: N/A;   DILATION AND CURETTAGE OF UTERUS      Family Psychiatric History: Two children (son autism)  Family History:  Family History  Problem Relation Age of Onset   COPD Mother    Hypertension Mother    Healthy Father    Sleep apnea Father    Hypertension Father     Social History:  Social History   Socioeconomic History   Marital status:  Divorced    Spouse name: Not on file   Number of children: Not on file   Years of education: Not on file   Highest education level: Not on file  Occupational History   Not on file  Tobacco Use   Smoking status: Never   Smokeless tobacco: Never  Substance and Sexual Activity   Alcohol use: Never   Drug use: No   Sexual activity: Yes  Other Topics Concern   Not on file  Social History Narrative   Not on file   Social Determinants of Health   Financial Resource Strain: Not on file  Food Insecurity: Not on file  Transportation Needs: Not on file  Physical Activity: Not on file  Stress: Not on file  Social Connections: Not on file    Allergies:  Allergies  Allergen Reactions   Pollen Extract Other (See Comments)    Metabolic Disorder Labs: No results found for: "HGBA1C", "MPG" No results found for: "PROLACTIN" No results found for: "CHOL", "TRIG", "HDL", "CHOLHDL", "VLDL", "LDLCALC" No results found for: "TSH"  Therapeutic Level Labs: No results found for: "LITHIUM" No results found for: "VALPROATE" No results found for: "CBMZ"  Current Medications: Current Outpatient Medications  Medication Sig Dispense Refill   diphenhydrAMINE HCl, Sleep, (UNISOM SLEEPMELTS) 25 MG TBDP Take 1 tablet (25 mg total) by mouth at bedtime as needed. 30 tablet 3   sertraline (ZOLOFT) 25 MG tablet Take 1 tablet (25 mg total) by mouth daily. 30 tablet 3   albuterol (VENTOLIN HFA) 108 (90 Base) MCG/ACT inhaler ProAir HFA 90 mcg/actuation aerosol inhaler  INHALE 2 PUFFS BY MOUTH EVERY 4 HOURS AS NEEDED     budesonide-formoterol (SYMBICORT) 160-4.5 MCG/ACT inhaler Inhale 2 puffs into the lungs in the morning and at bedtime. 1 each 6   doxycycline (VIBRAMYCIN) 100 MG capsule Take 100 mg by mouth daily.     hydrOXYzine (ATARAX) 25 MG tablet Take 1 tablet (25 mg total) by mouth 3 (three) times daily as needed. 90 tablet 3   ondansetron (ZOFRAN-ODT) 8 MG disintegrating tablet Take 8 mg by mouth 2  (two) times daily as needed for heartburn.     prazosin (MINIPRESS) 2 MG capsule Take 1 capsule (2 mg total) by mouth at bedtime. 30 capsule 3   Spacer/Aero-Holding Chambers DEVI 2 puffs by Does not apply route as needed. 2 each 2   SSD 1 % cream Apply topically 2 (two) times daily.     traZODone (DESYREL) 150 MG tablet TAKE 1 TABLET(150 MG) BY MOUTH AT BEDTIME 30 tablet 3   triamcinolone cream (KENALOG) 0.1 % SMARTSIG:sparingly Topical Twice Daily     No current facility-administered medications for this visit.     Musculoskeletal: Strength & Muscle Tone: within normal limits and Teleheath visit,  Gait & Station:  Teleheath visit Patient leans: N/A  Psychiatric Specialty Exam: Review of Systems  unknown if currently breastfeeding.There is no height or weight on file  to calculate BMI.  General Appearance: Well Groomed  Eye Contact:  Good  Speech:  Clear and Coherent and Normal Rate  Volume:  Normal  Mood:  Anxious and Depressed  Affect:  Appropriate and Congruent  Thought Process:  Coherent, Goal Directed, and Linear  Orientation:  Full (Time, Place, and Person)  Thought Content: WDL and Logical   Suicidal Thoughts:  No  Homicidal Thoughts:  No  Memory:  Immediate;   Good Recent;   Good Remote;   Good  Judgement:  Good  Insight:  Good  Psychomotor Activity:  Normal  Concentration:  Concentration: Good and Attention Span: Good  Recall:  Good  Fund of Knowledge: Good  Language: Good  Akathisia:  No  Handed:  Right  AIMS (if indicated): not done  Assets:  Communication Skills Desire for Improvement Financial Resources/Insurance Housing Physical Health Social Support Vocational/Educational  ADL's:  Intact  Cognition: WNL  Sleep:  Fair   Screenings: GAD-7    Flowsheet Row Video Visit from 06/30/2022 in [facility] Video Visit from 01/22/2022 in [facility] Office Visit from 10/25/2021 in [facility] Counselor from 08/09/2021 in [facility]  Total GAD-7 Score 19 18 14 21       PHQ2-9    Flowsheet Row Video Visit from 06/30/2022 in [facility] Video Visit from 01/22/2022 in [facility] Office Visit from 10/25/2021 in [facility] Counselor from 08/09/2021 in [facility]  PHQ-2 Total Score 4 4 3 2   PHQ-9 Total Score 18 19 11 17       Flowsheet Row Video Visit from 01/22/2022 in [facility] Counselor from 08/09/2021 in [facility] Admission (Discharged) from 02/19/2021 in [facility]  C-SSRS RISK CATEGORY Error: Q7 should not be populated when Q6 is No No Risk No Risk        Assessment and Plan: Patient endorses symptoms of anxiety, depression, and insomnia due to life stressors. Patient informed writer that her current dose of prazosin is helping her nightmares.  Today she is agreeable to increasing hydroxyzine 10 mg 3 times daily to 25 mg 3 times daily to help manage anxiety and sleep.  She is also agreeable to starting Zoloft 25 mg daily to help manage anxiety and depression.  Patient agreeable to start Unisom help manage sleep.  She will continue her other medication as prescribed.  Provider discussed starting Abilify at next visit if mood does not improve.  1. Generalized anxiety disorder  Continue- traZODone (DESYREL) 150 MG tablet; TAKE 1 TABLET(150 MG) BY MOUTH AT BEDTIME  Dispense: 30 tablet; Refill: 3 Increased- hydrOXYzine (ATARAX) 25 MG tablet; Take 1 tablet (25 mg total) by mouth 3 (three) times daily as needed.  Dispense: 90 tablet; Refill: 3 Start- sertraline (ZOLOFT) 25 MG tablet; Take 1 tablet (25 mg total) by mouth daily.  Dispense: 30 tablet; Refill: 3  2. MDD (major depressive disorder), recurrent episode, mild (HCC)  Continue- traZODone  (DESYREL) 150 MG tablet; TAKE 1 TABLET(150 MG) BY MOUTH AT BEDTIME  Dispense: 30 tablet; Refill: 3 Start- sertraline (ZOLOFT) 25 MG tablet; Take 1 tablet (25 mg total) by mouth daily.  Dispense: 30 tablet; Refill: 3 Stat- diphenhydrAMINE HCl, Sleep, (UNISOM SLEEPMELTS) 25 MG TBDP; Take 1 tablet (25 mg total) by mouth at bedtime as needed.  Dispense: 30 tablet; Refill: 3  3. PTSD (post-traumatic stress disorder)  Continue- prazosin (MINIPRESS) 2 MG capsule; Take 1 capsule (2 mg total) by mouth at bedtime.  Dispense: 30 capsule; Refill: 3  Follow-up in 50-month Follow-up for therapy  Shanna Cisco, NP 06/30/2022, 4:26 PM

## 2022-07-15 DIAGNOSIS — M546 Pain in thoracic spine: Secondary | ICD-10-CM | POA: Diagnosis not present

## 2022-07-15 DIAGNOSIS — M545 Low back pain, unspecified: Secondary | ICD-10-CM | POA: Diagnosis not present

## 2022-07-21 ENCOUNTER — Encounter (HOSPITAL_COMMUNITY): Payer: Self-pay

## 2022-07-21 ENCOUNTER — Ambulatory Visit (INDEPENDENT_AMBULATORY_CARE_PROVIDER_SITE_OTHER): Payer: Medicaid Other | Admitting: Clinical

## 2022-07-21 DIAGNOSIS — F411 Generalized anxiety disorder: Secondary | ICD-10-CM | POA: Diagnosis not present

## 2022-07-21 NOTE — Progress Notes (Signed)
THERAPIST PROGRESS NOTE Virtual Visit via Video Note  I connected with Nancy Mendoza on 07/21/22 at 11:00 AM EDT by a video enabled telemedicine application and verified that I am speaking with the correct person using two identifiers.  Location: Patient: home Provider: office   I discussed the limitations of evaluation and management by telemedicine and the availability of in person appointments. The patient expressed understanding and agreed to proceed.   Follow Up Instructions: I discussed the assessment and treatment plan with the patient. The patient was provided an opportunity to ask questions and all were answered. The patient agreed with the plan and demonstrated an understanding of the instructions.   The patient was advised to call back or seek an in-person evaluation if the symptoms worsen or if the condition fails to improve as anticipated.   Session Time: 35 minutes  Participation Level: Active  Behavioral Response: CasualAlertEuthymic  Type of Therapy: Individual Therapy  Treatment Goals addressed: Client will practice problem-solving skills 3 times per week for the next 4 weeks  ProgressTowards Goals: Progressing  Interventions: CBT  Summary:  Nancy Mendoza is a 31 y.o. female who presents for the scheduled appointment oriented x5, appropriately dressed, and friendly. Client denies hallucinations and delusions. Client reported on today she has been maintaining fairly well. Client reported she had medications adjusted with a psychiatrist and she is feeling better.  Client reported work and school has been going very well for her.  Client reported she received 7 scholarships for her semester at [employer] in the fall.  Client reported she went to court for violation of the 50b by her ex-husband. Client reported ultimately he was charged with 1 thing but not another. Client reported overall she has not felt safe in her day to activity. Client reported she does  not feel protected in [place] considering her situation and not knowing there would be loop holes in a 50b. Client reported she has been working with lawyers for her case. Client reported she is considering moving out of state when the 50b expires in November this year. Client reported she has a support system for her children and resources for her schooling. Client reported if she moved it would be hard to find the resources her 2 children who have special needs and the change would be hard on them. Client reported she has deleted her social media accounts and stopped talking to her friends. Evidence of progress towards goal:  client I medication compliant 7 days out of the week. Client reported 1 positive coping skill of having a optimistic mindset about her stressors.    Suicidal/Homicidal: Nowithout intent/plan  Therapist Response:  Therapist began the appointment asking the client how she has bene doing since last seen. Therapist used CBT to engage using active listening and positive emotional support. Therapist used CBT to engage and ask the client about update with her legal proceedings. Therapist used CBT to ask the client how her daily activity has been affected by stress regarding the family dynamic. Therapist used CBT to normalize the clients feelings and discuss having balanced thoughts. Therapist assigned the client to work on self care.    Plan: Return again in 5 weeks.  Diagnosis: generalized anxiety disorder  Collaboration of Care: Other client inquired about weekly therapy. Therapist gave the client information to the [facility] for additional support groups.  Patient/Guardian was advised Release of Information must be obtained prior to any record release in order to collaborate their care  with an outside provider. Patient/Guardian was advised if they have not already done so to contact the registration department to sign all necessary forms in order for [place] to  release information regarding their care.   Consent: Patient/Guardian gives verbal consent for treatment and assignment of benefits for services provided during this visit. Patient/Guardian expressed understanding and agreed to proceed.   Neena Rhymes Hagen Tidd, LCSW 07/21/2022

## 2022-07-21 NOTE — Plan of Care (Signed)
  Problem: Anxiety Disorder CCP Problem  1  Goal: LTG: Patient will score less than 5 on the Generalized Anxiety Disorder 7 Scale (GAD-7) Outcome: Progressing Goal: STG: Patient will practice problem solving skills 3 times per week for the next 4 weeks Outcome: Progressing   

## 2022-07-29 DIAGNOSIS — Z419 Encounter for procedure for purposes other than remedying health state, unspecified: Secondary | ICD-10-CM | POA: Diagnosis not present

## 2022-07-30 ENCOUNTER — Telehealth (HOSPITAL_COMMUNITY): Payer: Self-pay | Admitting: *Deleted

## 2022-07-30 NOTE — Telephone Encounter (Signed)
Rx Refill Request --hydrOXYzine (ATARAX) 25 MG tablet Take 1 tablet (25 mg total) by mouth  3 (three) times daily as needed

## 2022-07-30 NOTE — Telephone Encounter (Signed)
Rx Refill Request -- prazosin (MINIPRESS) 2 MG capsule  Take 1 capsule (2 mg total) by  mouth at bedtime

## 2022-07-31 NOTE — Telephone Encounter (Signed)
Received message that patient was requesting refill of her hydroxyzine.  However, she received a prescription with 3 refills on 7/3 so does not need a refill at this time.    Arna Snipe MD Resident

## 2022-08-03 IMAGING — DX DG CHEST 2V
2 series · 2 of 2 positions shown · non-contrast
Comparison: None.

CLINICAL DATA: Dyspnea on exertion.

EXAM:
CHEST - 2 VIEW

[chest pa]
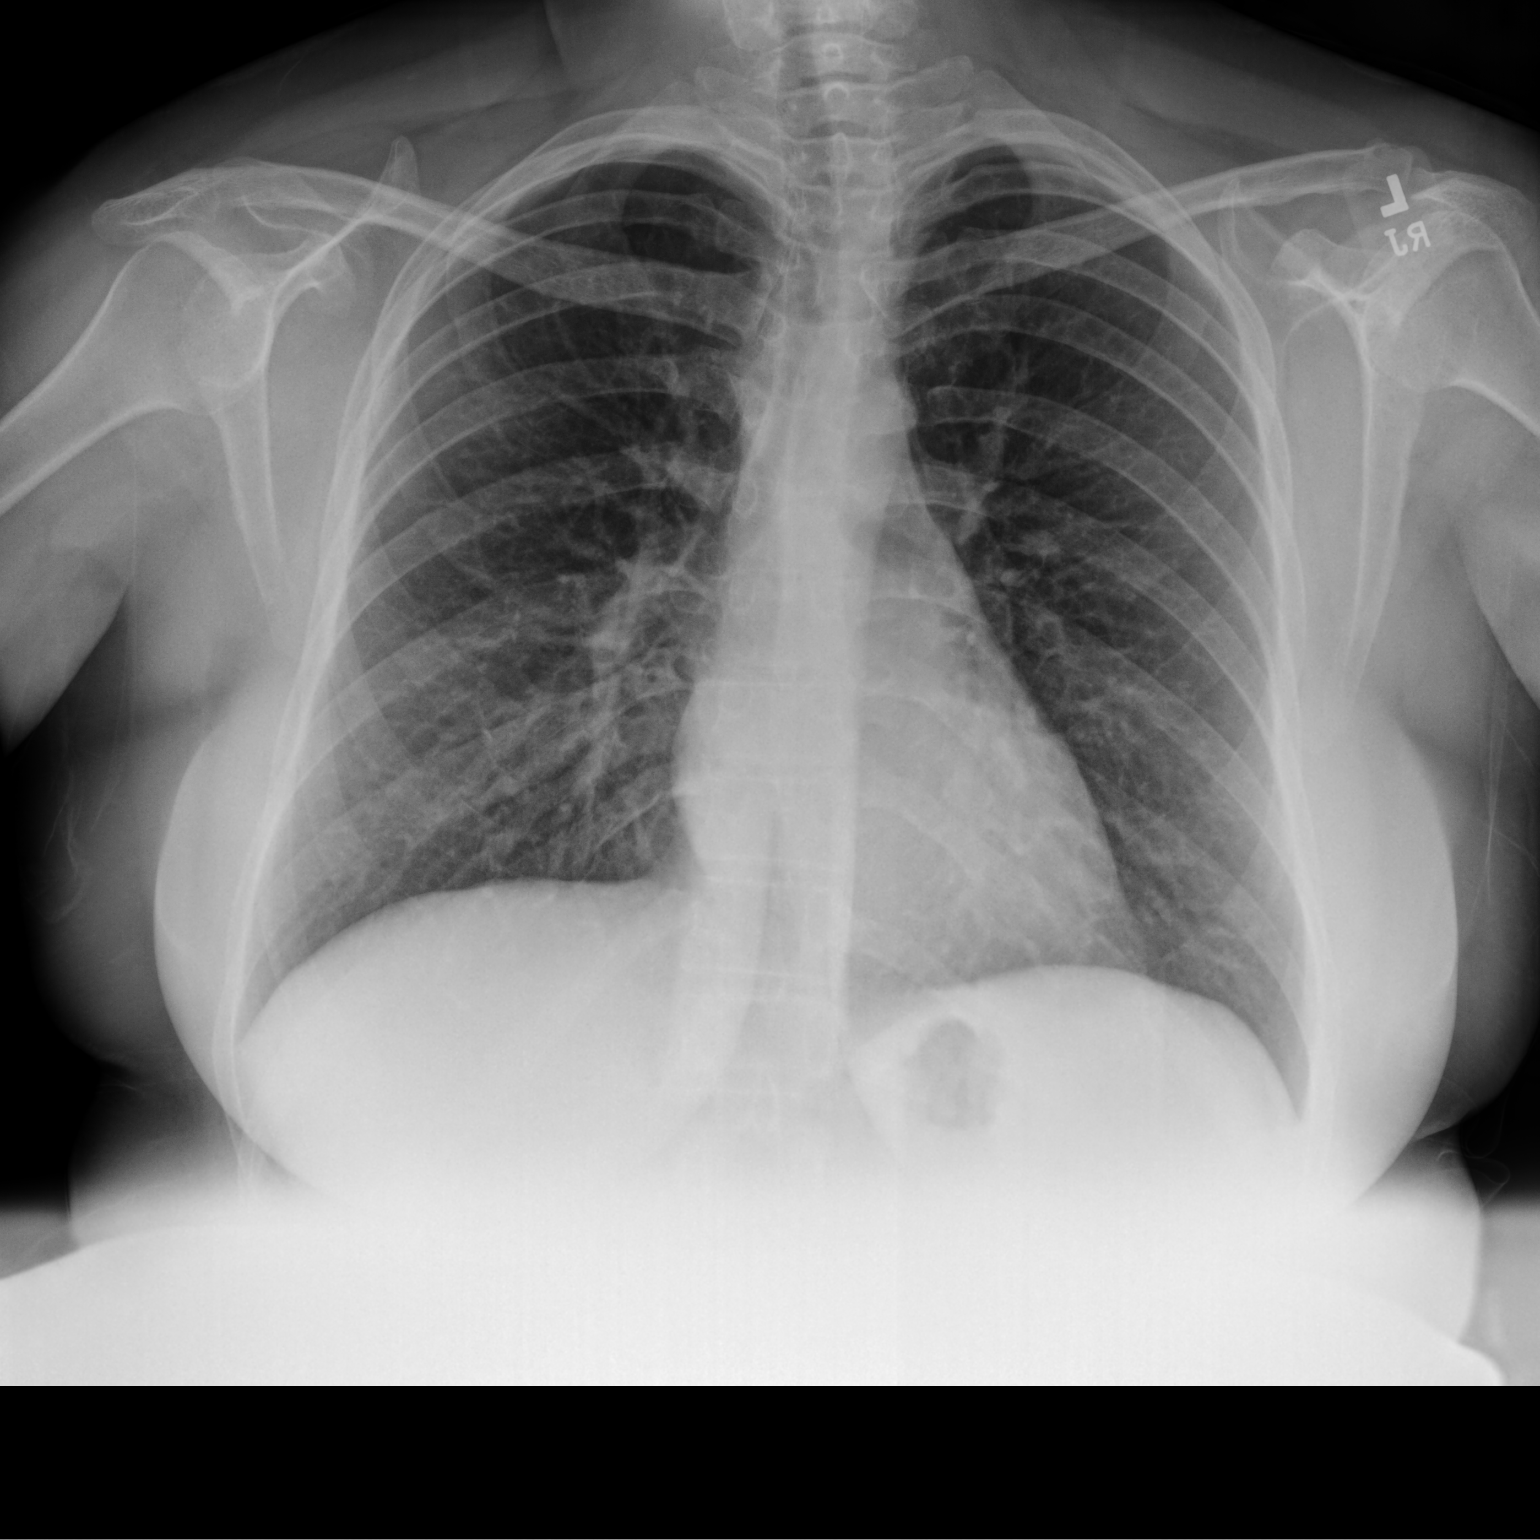

[chest lat]
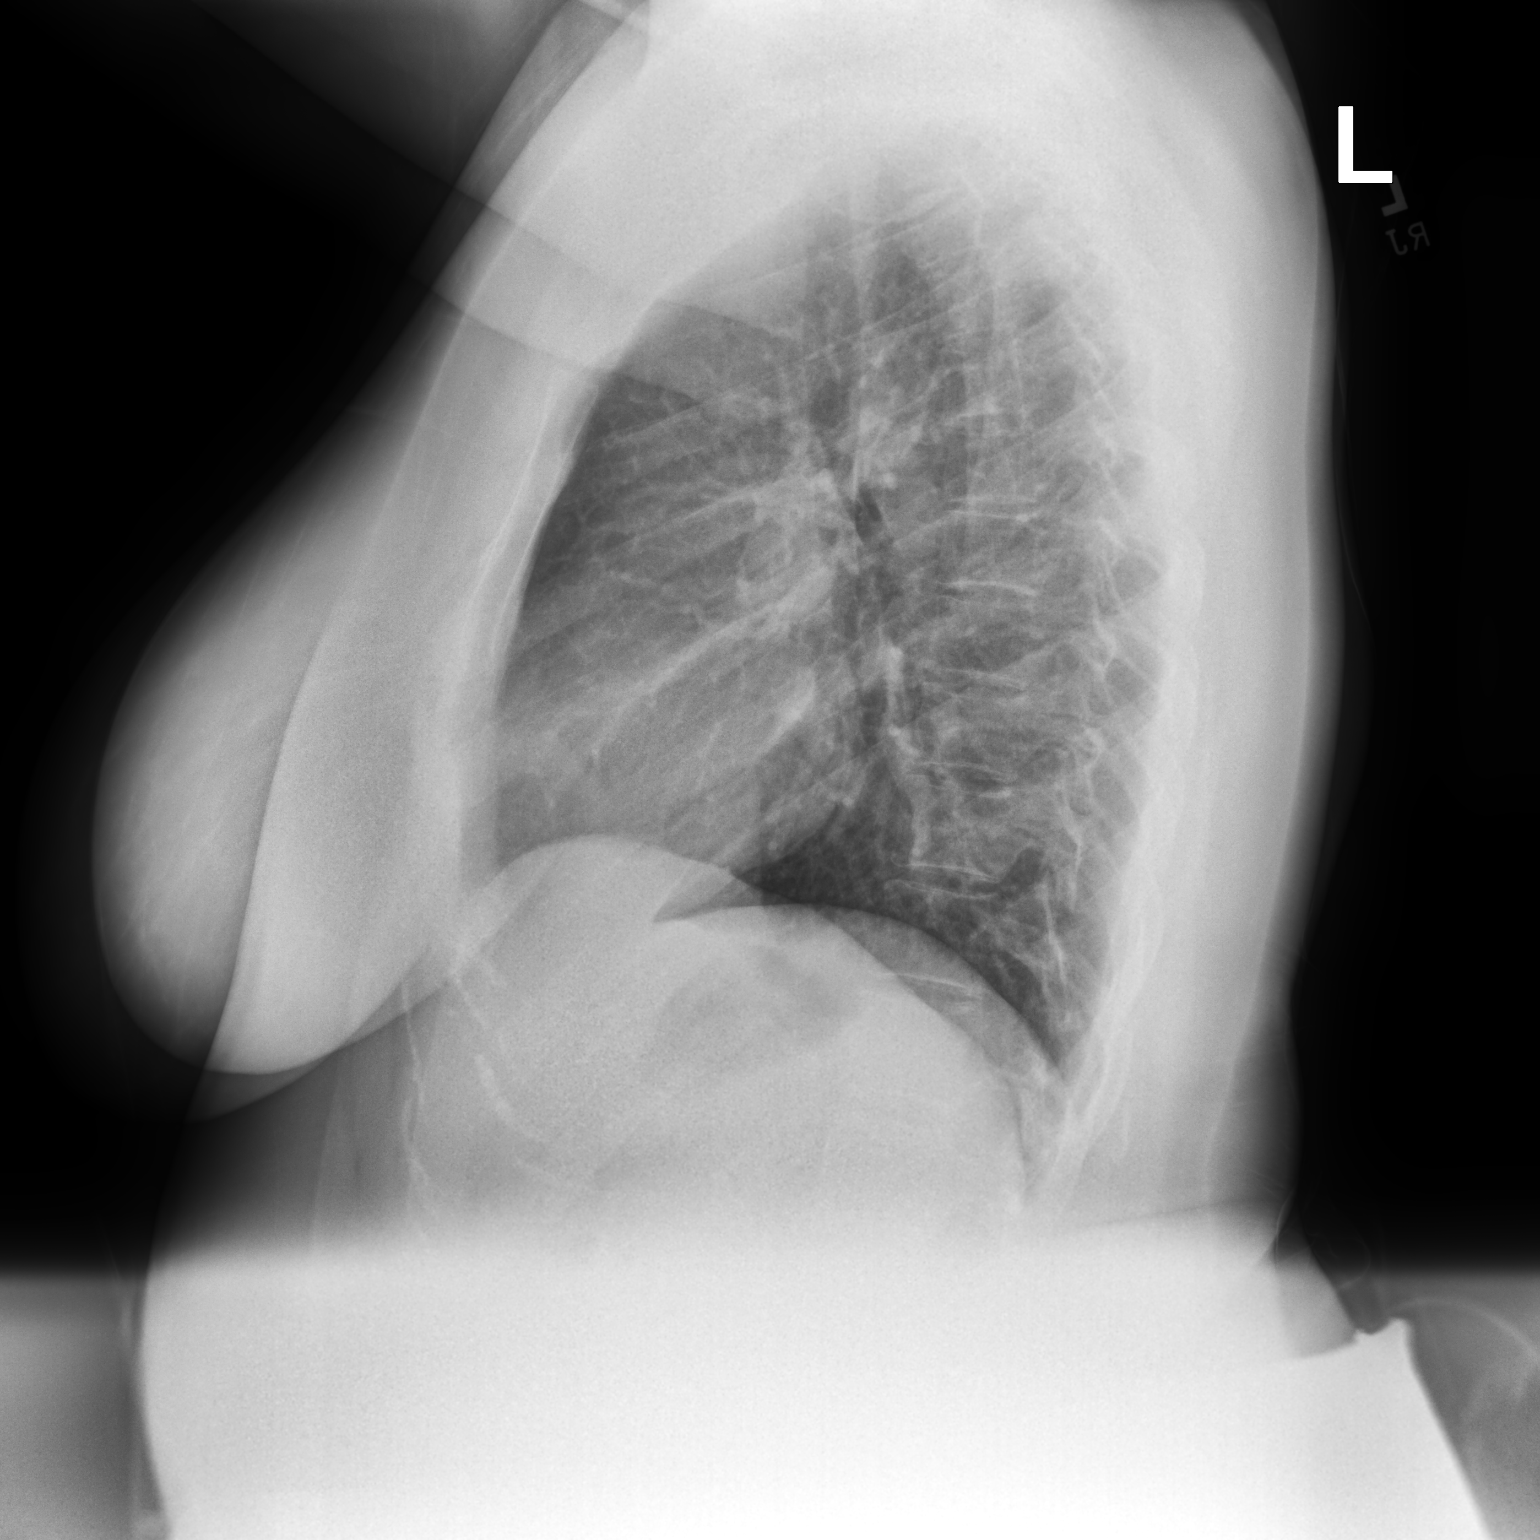

[2 of 2 positions shown; findings below may reference images not displayed]

FINDINGS: The heart size and mediastinal contours are within normal limits.
Both lungs are clear. The visualized skeletal structures are
unremarkable.
IMPRESSION: Normal exam.

## 2022-08-12 ENCOUNTER — Encounter: Payer: Self-pay | Admitting: Pulmonary Disease

## 2022-08-13 DIAGNOSIS — N920 Excessive and frequent menstruation with regular cycle: Secondary | ICD-10-CM | POA: Diagnosis not present

## 2022-08-29 DIAGNOSIS — Z419 Encounter for procedure for purposes other than remedying health state, unspecified: Secondary | ICD-10-CM | POA: Diagnosis not present

## 2022-09-16 DIAGNOSIS — M5442 Lumbago with sciatica, left side: Secondary | ICD-10-CM | POA: Diagnosis not present

## 2022-09-17 ENCOUNTER — Encounter: Payer: Self-pay | Admitting: Pulmonary Disease

## 2022-09-17 ENCOUNTER — Ambulatory Visit (INDEPENDENT_AMBULATORY_CARE_PROVIDER_SITE_OTHER): Payer: Medicaid Other | Admitting: Pulmonary Disease

## 2022-09-17 VITALS — BP 114/74 | HR 68 | Temp 97.8°F | Ht 64.0 in | Wt 197.0 lb

## 2022-09-17 DIAGNOSIS — J4540 Moderate persistent asthma, uncomplicated: Secondary | ICD-10-CM | POA: Diagnosis not present

## 2022-09-17 MED ORDER — FLUTICASONE-SALMETEROL 230-21 MCG/ACT IN AERO: 2.0000 | INHALATION_SPRAY | Freq: Two times a day (BID) | RESPIRATORY_TRACT | 12 refills | Status: DC

## 2022-09-17 NOTE — Progress Notes (Signed)
@Patient  ID: Nancy Mendoza, female    DOB: 1991-04-16, 31 y.o.   MRN: [id number]  Chief complaint: dyspnea   Referring provider: Everardo Beals, NP  HPI:   31 y.o. with history asthma whom we have seen in follow up for evaluation of dyspnea on exertion.    Symptoms better controlled on Symbicort.  Unfortunately, developed throat discomfort after using this in the interim since last visit 3 months ago.  Noticed after using it with spacer.  Was not an issue prior to this.  She stopped Symbicort and her throat discomfort has improved and now resolved.  However her dyspnea and asthma symptoms seem worse.   HPI at initial visit: Diagnosed with asthma as a child.  Got better.  Over the years.  Worsening dyspnea over the last 6 to 8 months.  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  For this not really any issues with breathing.  Use albuterol prior to work without noticeable improvement in breathing.  No other time of day when things are better or worse.  No position make things better or worse.  Does have seasonal allergies but unsure if seasonal changes are making things better or worse.  No other alleviating or exacerbating factors.  Never used maintenance inhaler.  No recent chest imaging.  Review of labs indicates history of anemia, last CBC 01/2021.  History of fluctuant anemia in the past but largely centers around times of her cesarean section, birth of children.  PMH: Asthma, seasonal allergies Surgical history: C-section x3 Family history: COPD hypertension in mother, hypertension sleep apnea father Social history: Never smoker, lives in [place], works at [employer] / Pulmonary Flowsheets:   ACT:  Asthma Control Test ACT Total Score  06/11/2022  3:14 PM 21  04/16/2022  2:03 PM 9    MMRC:     No data to display           Epworth:      No data to display           Tests:   FENO:  No  results found for: "NITRICOXIDE"  PFT:    Latest Ref Rng & Units 05/29/2022   11:56 AM  PFT Results  FVC-Pre L 3.24   FVC-Predicted Pre % 122   FVC-Post L 3.68   FVC-Predicted Post % 139   Pre FEV1/FVC % % 54   Post FEV1/FCV % % 86   FEV1-Pre L 1.74   FEV1-Predicted Pre % 75   FEV1-Post L 3.19   DLCO uncorrected ml/min/mmHg 26.49   DLCO UNC% % 170   DLCO corrected ml/min/mmHg 26.40   DLCO COR %Predicted % 170   DLVA Predicted % 121   TLC L 4.17   TLC % Predicted % 117   RV % Predicted % 78   Personally reviewed and interpreted as significant bronchodilator response  WALK:      No data to display           Imaging:   Lab Results: Personally reviewed CBC    Component Value Date/Time   WBC 11.1 (H) 04/16/2022 1446   RBC 4.76 04/16/2022 1446   HGB 13.5 04/16/2022 1446   HCT 41.2 04/16/2022 1446   PLT 263.0 04/16/2022 1446   MCV 86.6 04/16/2022 1446   MCH 26.2 02/20/2021 0452   MCHC 32.9 04/16/2022 1446   RDW 13.6 04/16/2022 1446   LYMPHSABS 2.5 04/16/2022 1446  MONOABS 0.7 04/16/2022 1446   EOSABS 0.1 04/16/2022 1446   BASOSABS 0.1 04/16/2022 1446    BMET    Component Value Date/Time   NA 139 08/28/2018 0351   K 4.1 08/28/2018 0351   CL 104 08/28/2018 0351   CO2 23 08/28/2018 0351   GLUCOSE 86 08/28/2018 0351   BUN 13 08/28/2018 0351   CREATININE 0.72 08/28/2018 0351   CALCIUM 8.5 (L) 08/28/2018 0351   GFRNONAA >60 08/28/2018 0351   GFRAA >60 08/28/2018 0351    BNP No results found for: "BNP"  ProBNP No results found for: "PROBNP"  Specialty Problems   None   Allergies  Allergen Reactions   Pollen Extract Other (See Comments)    Immunization History  Administered Date(s) Administered   Hepatitis B, PED/ADOLESCENT June 23, 1991, 02/27/1992, 07/10/1992   Influenza,inj,Quad PF,6+ Mos 02/26/2019   Influenza,inj,quad, With Preservative 10/09/2016, 09/22/2020   Influenza-Unspecified 09/22/2020, 09/20/2021   MMR 03/11/1993    PFIZER(Purple Top)SARS-COV-2 Vaccination 01/07/2020, 01/28/2020, 10/26/2020   Pfizer Covid-19 Vaccine Bivalent Booster 15yrs & up 12/26/2021   Tdap 12/14/2020    Past Medical History:  Diagnosis Date   Anemia    History of postpartum hemorrhage     Tobacco History: Social History   Tobacco Use  Smoking Status Never  Smokeless Tobacco Never   Counseling given: Not Answered   Continue to not smoke  Outpatient Encounter Medications as of 09/17/2022  Medication Sig   albuterol (VENTOLIN HFA) 108 (90 Base) MCG/ACT inhaler ProAir HFA 90 mcg/actuation aerosol inhaler  INHALE 2 PUFFS BY MOUTH EVERY 4 HOURS AS NEEDED   diphenhydrAMINE HCl, Sleep, (UNISOM SLEEPMELTS) 25 MG TBDP Take 1 tablet (25 mg total) by mouth at bedtime as needed.   drospirenone-ethinyl estradiol (YAZ) 3-0.02 MG tablet Take 1 tablet by mouth daily.   fluticasone-salmeterol (ADVAIR HFA) 230-21 MCG/ACT inhaler Inhale 2 puffs into the lungs 2 (two) times daily.   hydrOXYzine (ATARAX) 25 MG tablet Take 1 tablet (25 mg total) by mouth 3 (three) times daily as needed.   prazosin (MINIPRESS) 2 MG capsule Take 1 capsule (2 mg total) by mouth at bedtime.   sertraline (ZOLOFT) 25 MG tablet Take 1 tablet (25 mg total) by mouth daily.   Spacer/Aero-Holding Chambers DEVI 2 puffs by Does not apply route as needed.   traZODone (DESYREL) 150 MG tablet TAKE 1 TABLET(150 MG) BY MOUTH AT BEDTIME   [DISCONTINUED] budesonide-formoterol (SYMBICORT) 160-4.5 MCG/ACT inhaler Inhale 2 puffs into the lungs in the morning and at bedtime.   [DISCONTINUED] doxycycline (VIBRAMYCIN) 100 MG capsule Take 100 mg by mouth daily.   [DISCONTINUED] ondansetron (ZOFRAN-ODT) 8 MG disintegrating tablet Take 8 mg by mouth 2 (two) times daily as needed for heartburn.   [DISCONTINUED] SSD 1 % cream Apply topically 2 (two) times daily.   [DISCONTINUED] triamcinolone cream (KENALOG) 0.1 % SMARTSIG:sparingly Topical Twice Daily   No facility-administered  encounter medications on file as of 09/17/2022.     Review of Systems  Review of Systems  N/a Physical Exam  BP 114/74 (BP Location: Left Arm, Patient Position: Sitting, Cuff Size: Normal)   Pulse 68   Temp 97.8 F (36.6 C) (Oral)   Ht $R'5\' 4"'Pm$  (1.626 m)   Wt 197 lb (89.4 kg)   SpO2 99%   BMI 33.81 kg/m   Wt Readings from Last 5 Encounters:  09/17/22 197 lb (89.4 kg)  06/11/22 197 lb 9.6 oz (89.6 kg)  04/16/22 190 lb (86.2 kg)  02/19/21 190 lb (86.2 kg)  02/11/21 190 lb (86.2 kg)    BMI Readings from Last 5 Encounters:  09/17/22 33.81 kg/m  06/11/22 33.92 kg/m  04/16/22 32.61 kg/m  02/19/21 32.61 kg/m  02/11/21 32.61 kg/m     Physical Exam General: Well-appearing, no acute distress Eyes: EOMI, icterus Neck: Supple no JVP Pulmonary: Clear, normal work of breathing. Cardiovascular: Regular rate and rhythm, no murmur Abdomen: Nontender, soft present MSK: No synovitis, no effusion Neuro: Normal gait, no weakness Psych: Normal full affect  Assessment & Plan:   Dyspnea on exertion: Likely multifactorial related to deconditioning, high concern for uncontrolled asthma.  Worsened with new work environment.  PFTs consistent with asthma.  Symptoms improved with Symbicort.  Switch to Advair 08/2022 given throat discomfort with Symbicort.  Asthma: Moderate persistent.  No improvement with albuterol.  Childhood diagnosis.  Worsening dyspnea after changing jobs, new work environment.  Switch to Advair HFA given throat irritation with Symbicort.   Return in about 4 months (around 01/17/2023).   Lanier Clam, MD 09/17/2022

## 2022-09-17 NOTE — Patient Instructions (Addendum)
Nice to see you again  I am sorry about the Symbicort, perhaps an active ingredient became an irritant  Stay off the Symbicort as you are  I sent a prescription for Advair 2 puffs twice a day.  Use with spacer.  Rinse out your mouth after every use.  Let me know if there are any issues.  I have openings October 11 and October 12 if you are mother decides she wants to be seen in clinic  Return to clinic in 4 months or sooner as needed with Dr. Silas Flood

## 2022-09-22 ENCOUNTER — Other Ambulatory Visit (HOSPITAL_COMMUNITY): Payer: Self-pay

## 2022-09-23 ENCOUNTER — Telehealth: Payer: Self-pay | Admitting: Pulmonary Disease

## 2022-09-23 ENCOUNTER — Other Ambulatory Visit (HOSPITAL_COMMUNITY): Payer: Self-pay

## 2022-09-23 NOTE — Telephone Encounter (Signed)
Pharmacy Patient Advocate Encounter  Insurance verification completed.    Ran test claims for the following: Advair HFA 230-21 MCG/ACT .  Patient copay is $0.00 for brand name

## 2022-09-26 NOTE — Telephone Encounter (Signed)
Pt returned call about her Advair HFA inhaler. Stated to pt the info from pharmacy team that the Brand name Advair HFA is covered showing $0 copay. Pt said that the Rx was still showing that a PA was required so stated to pt that I would call pharmacy to verify and she verbalized understanding.   Called pt's pharmacy but was on hold for 7 minutes so hung up. Called pt back letting her know to call pharmacy back and tell them that inhaler needs to be brand name Advair HFA and she verbalized understanding. Nothing further needed.

## 2022-09-26 NOTE — Telephone Encounter (Signed)
Called patient but she did not answer. Left message for patient to call back.  

## 2022-09-28 DIAGNOSIS — Z419 Encounter for procedure for purposes other than remedying health state, unspecified: Secondary | ICD-10-CM | POA: Diagnosis not present

## 2022-10-01 ENCOUNTER — Telehealth (HOSPITAL_COMMUNITY): Payer: Medicaid Other | Admitting: Psychiatry

## 2022-10-07 ENCOUNTER — Encounter (HOSPITAL_COMMUNITY): Payer: Self-pay | Admitting: Psychiatry

## 2022-10-07 ENCOUNTER — Telehealth (INDEPENDENT_AMBULATORY_CARE_PROVIDER_SITE_OTHER): Payer: Medicaid Other | Admitting: Psychiatry

## 2022-10-07 DIAGNOSIS — M25552 Pain in left hip: Secondary | ICD-10-CM | POA: Diagnosis not present

## 2022-10-07 DIAGNOSIS — F330 Major depressive disorder, recurrent, mild: Secondary | ICD-10-CM

## 2022-10-07 DIAGNOSIS — M545 Low back pain, unspecified: Secondary | ICD-10-CM | POA: Diagnosis not present

## 2022-10-07 DIAGNOSIS — F4310 Post-traumatic stress disorder, unspecified: Secondary | ICD-10-CM

## 2022-10-07 DIAGNOSIS — F411 Generalized anxiety disorder: Secondary | ICD-10-CM | POA: Diagnosis not present

## 2022-10-07 MED ORDER — TRAZODONE HCL 150 MG PO TABS: ORAL_TABLET | ORAL | 3 refills | Status: DC

## 2022-10-07 MED ORDER — SERTRALINE HCL 25 MG PO TABS: 25.0000 mg | ORAL_TABLET | Freq: Every day | ORAL | 3 refills | Status: DC

## 2022-10-07 MED ORDER — HYDROXYZINE HCL 25 MG PO TABS: 25.0000 mg | ORAL_TABLET | Freq: Three times a day (TID) | ORAL | 3 refills | Status: DC | PRN

## 2022-10-07 MED ORDER — PRAZOSIN HCL 2 MG PO CAPS: 2.0000 mg | ORAL_CAPSULE | Freq: Every day | ORAL | 3 refills | Status: DC

## 2022-10-07 MED ORDER — UNISOM SLEEPMELTS 25 MG PO TBDP: 25.0000 mg | ORAL_TABLET | Freq: Every evening | ORAL | 3 refills | Status: DC | PRN

## 2022-10-07 NOTE — Progress Notes (Signed)
[facility] MD/PA/NP OP Progress Note Virtual Visit via Video Note  I connected with Nancy Mendoza on 10/07/22 at  1:30 PM EDT by a video enabled telemedicine application and verified that I am speaking with the correct person using two identifiers.  Location: Patient: Home Provider: Clinic   I discussed the limitations of evaluation and management by telemedicine and the availability of in person appointments. The patient expressed understanding and agreed to proceed.  I provided 30 minutes of non-face-to-face time during this encounter.   10/07/2022 3:05 PM Nancy Mendoza  MRN:  [id number]  Chief Complaint: "I see an improvement with the Zoloft "  HPI: 31 year old female seen today for follow up psychiatric evaluation. She has a psychiatric history of anxiety, depression, and PTSD. Currently she managed on trazodone 150 mg nightly as needed, Zoloft 25 mg daily, prazosin 2 mg nightly, Unisom 25 mg nightly, and hydroxyzine 25 mg 3 times daily.  She notes her medications are somewhat effective for managing her psychiatric conditions.  Today patient was well groomed, pleasant, cooperative, engaged in conversation and maintained eye contact.  She informed Clinical research associate that she has seen an improvement since starting Zoloft.  She informed Clinical research associate that since her last visit she has been less depressed and anxious.  Provider conducted a GAD-7 and patient scored a 7, at her last visit she scored a 19.  Provider also conducted PHQ-9 patient scored a 3, at her last visit she scored an 18.  She notes that Unisom has affected her sleep positively and notes that she is now sleeps 6 to 8 hours nightly.  Today she denies SI/HI/AVH, mania, or paranoia.    Patient informed Clinical research associate that her 3 children are doing well.  She notes that she still has some stress brought on by her ex-husband who has violated the 50 B restraining order by coming to their home.  She notes that she plans to have her lawyer recertify the B be  before it expires in November.    No medication changes made today.  Patient agreeable to continue medication as prescribed.  No other concerns at this time.  She will follow-up with outpatient counseling for therapy.  No other concerns at this time.      Visit Diagnosis:    ICD-10-CM   1. MDD (major depressive disorder), recurrent episode, mild (HCC)  F33.0 diphenhydrAMINE HCl, Sleep, (UNISOM SLEEPMELTS) 25 MG TBDP    sertraline (ZOLOFT) 25 MG tablet    traZODone (DESYREL) 150 MG tablet    2. Generalized anxiety disorder  F41.1 hydrOXYzine (ATARAX) 25 MG tablet    sertraline (ZOLOFT) 25 MG tablet    traZODone (DESYREL) 150 MG tablet    3. PTSD (post-traumatic stress disorder)  F43.10 prazosin (MINIPRESS) 2 MG capsule      Past Psychiatric History: Anxiety and PTSD  Past Medical History:  Past Medical History:  Diagnosis Date   Anemia    History of postpartum hemorrhage     Past Surgical History:  Procedure Laterality Date   CESAREAN SECTION     CESAREAN SECTION N/A 02/19/2021   Procedure: REPEAT CESAREAN SECTION;  Surgeon: Carrington Clamp, MD;  Location: [facility];  Service: Obstetrics;  Laterality: N/A;   DILATION AND CURETTAGE OF UTERUS      Family Psychiatric History: Two children (son autism)  Family History:  Family History  Problem Relation Age of Onset   COPD Mother    Hypertension Mother    Healthy Father  Sleep apnea Father    Hypertension Father     Social History:  Social History   Socioeconomic History   Marital status: Divorced    Spouse name: Not on file   Number of children: Not on file   Years of education: Not on file   Highest education level: Not on file  Occupational History   Not on file  Tobacco Use   Smoking status: Never   Smokeless tobacco: Never  Substance and Sexual Activity   Alcohol use: Never   Drug use: No   Sexual activity: Yes  Other Topics Concern   Not on file  Social History Narrative   Not on file   Social  Determinants of Health   Financial Resource Strain: Not on file  Food Insecurity: Not on file  Transportation Needs: Not on file  Physical Activity: Not on file  Stress: Not on file  Social Connections: Not on file    Allergies:  Allergies  Allergen Reactions   Pollen Extract Other (See Comments)    Metabolic Disorder Labs: No results found for: "HGBA1C", "MPG" No results found for: "PROLACTIN" No results found for: "CHOL", "TRIG", "HDL", "CHOLHDL", "VLDL", "LDLCALC" No results found for: "TSH"  Therapeutic Level Labs: No results found for: "LITHIUM" No results found for: "VALPROATE" No results found for: "CBMZ"  Current Medications: Current Outpatient Medications  Medication Sig Dispense Refill   albuterol (VENTOLIN HFA) 108 (90 Base) MCG/ACT inhaler ProAir HFA 90 mcg/actuation aerosol inhaler  INHALE 2 PUFFS BY MOUTH EVERY 4 HOURS AS NEEDED     diphenhydrAMINE HCl, Sleep, (UNISOM SLEEPMELTS) 25 MG TBDP Take 1 tablet (25 mg total) by mouth at bedtime as needed. 30 tablet 3   drospirenone-ethinyl estradiol (YAZ) 3-0.02 MG tablet Take 1 tablet by mouth daily.     fluticasone-salmeterol (ADVAIR HFA) 230-21 MCG/ACT inhaler Inhale 2 puffs into the lungs 2 (two) times daily. 1 each 12   hydrOXYzine (ATARAX) 25 MG tablet Take 1 tablet (25 mg total) by mouth 3 (three) times daily as needed. 90 tablet 3   prazosin (MINIPRESS) 2 MG capsule Take 1 capsule (2 mg total) by mouth at bedtime. 30 capsule 3   sertraline (ZOLOFT) 25 MG tablet Take 1 tablet (25 mg total) by mouth daily. 30 tablet 3   Spacer/Aero-Holding Chambers DEVI 2 puffs by Does not apply route as needed. 2 each 2   traZODone (DESYREL) 150 MG tablet TAKE 1 TABLET(150 MG) BY MOUTH AT BEDTIME 30 tablet 3   No current facility-administered medications for this visit.     Musculoskeletal: Strength & Muscle Tone: within normal limits and Teleheath visit,  Gait & Station:  Teleheath visit Patient leans: N/A  Psychiatric  Specialty Exam: Review of Systems  unknown if currently breastfeeding.There is no height or weight on file to calculate BMI.  General Appearance: Well Groomed  Eye Contact:  Good  Speech:  Clear and Coherent and Normal Rate  Volume:  Normal  Mood:  Euthymic  Affect:  Appropriate and Congruent  Thought Process:  Coherent, Goal Directed, and Linear  Orientation:  Full (Time, Place, and Person)  Thought Content: WDL and Logical   Suicidal Thoughts:  No  Homicidal Thoughts:  No  Memory:  Immediate;   Good Recent;   Good Remote;   Good  Judgement:  Good  Insight:  Good  Psychomotor Activity:  Normal  Concentration:  Concentration: Good and Attention Span: Good  Recall:  Good  Fund of Knowledge: Good  Language:  Good  Akathisia:  No  Handed:  Right  AIMS (if indicated): not done  Assets:  Communication Skills Desire for Improvement Financial Resources/Insurance Housing Physical Health Social Support Vocational/Educational  ADL's:  Intact  Cognition: WNL  Sleep:  Good   Screenings: GAD-7    Flowsheet Row Video Visit from 10/07/2022 in [facility] Video Visit from 06/30/2022 in [facility] Video Visit from 01/22/2022 in [facility] Office Visit from 10/25/2021 in [facility] Counselor from 08/09/2021 in [facility]  Total GAD-7 Score 7 19 18 14 21       PHQ2-9    Flowsheet Row Video Visit from 10/07/2022 in [facility] Video Visit from 06/30/2022 in [facility] Video Visit from 01/22/2022 in [facility] Office Visit from 10/25/2021 in [facility] Counselor from 08/09/2021 in [facility]  PHQ-2 Total Score 0 4 4 3 2   PHQ-9 Total Score 3 18 19 11 17       Flowsheet Row Video Visit from 01/22/2022 in  [facility] Counselor from 08/09/2021 in [facility] Admission (Discharged) from 02/19/2021 in [place] 4S Mother Baby Unit  C-SSRS RISK CATEGORY Error: Q7 should not be populated when Q6 is No No Risk No Risk        Assessment and Plan: Patient notes that her anxiety, depression, and sleep has improved since her last visit.  No medication changes made today.  Patient agreeable to continue medication as prescribed.  1. Generalized anxiety disorder  Continue- traZODone (DESYREL) 150 MG tablet; TAKE 1 TABLET(150 MG) BY MOUTH AT BEDTIME  Dispense: 30 tablet; Refill: 3 Continue- hydrOXYzine (ATARAX) 25 MG tablet; Take 1 tablet (25 mg total) by mouth 3 (three) times daily as needed.  Dispense: 90 tablet; Refill: 3 Continue- sertraline (ZOLOFT) 25 MG tablet; Take 1 tablet (25 mg total) by mouth daily.  Dispense: 30 tablet; Refill: 3  2. MDD (major depressive disorder), recurrent episode, mild (HCC)  Continue- traZODone (DESYREL) 150 MG tablet; TAKE 1 TABLET(150 MG) BY MOUTH AT BEDTIME  Dispense: 30 tablet; Refill: 3 Continue- sertraline (ZOLOFT) 25 MG tablet; Take 1 tablet (25 mg total) by mouth daily.  Dispense: 30 tablet; Refill: 3 Continue- diphenhydrAMINE HCl, Sleep, (UNISOM SLEEPMELTS) 25 MG TBDP; Take 1 tablet (25 mg total) by mouth at bedtime as needed.  Dispense: 30 tablet; Refill: 3  3. PTSD (post-traumatic stress disorder)  Continue- prazosin (MINIPRESS) 2 MG capsule; Take 1 capsule (2 mg total) by mouth at bedtime.  Dispense: 30 capsule; Refill: 3  Follow-up in 70-month Follow-up for therapy  Salley Slaughter, NP 10/07/2022, 3:05 PM

## 2022-10-24 DIAGNOSIS — M5451 Vertebrogenic low back pain: Secondary | ICD-10-CM | POA: Diagnosis not present

## 2022-10-29 DIAGNOSIS — Z419 Encounter for procedure for purposes other than remedying health state, unspecified: Secondary | ICD-10-CM | POA: Diagnosis not present

## 2022-11-28 DIAGNOSIS — Z419 Encounter for procedure for purposes other than remedying health state, unspecified: Secondary | ICD-10-CM | POA: Diagnosis not present

## 2022-12-01 DIAGNOSIS — M5459 Other low back pain: Secondary | ICD-10-CM | POA: Diagnosis not present

## 2022-12-29 DIAGNOSIS — Z419 Encounter for procedure for purposes other than remedying health state, unspecified: Secondary | ICD-10-CM | POA: Diagnosis not present

## 2023-01-02 DIAGNOSIS — M5416 Radiculopathy, lumbar region: Secondary | ICD-10-CM | POA: Diagnosis not present

## 2023-01-22 DIAGNOSIS — M5459 Other low back pain: Secondary | ICD-10-CM | POA: Diagnosis not present

## 2023-01-29 DIAGNOSIS — Z419 Encounter for procedure for purposes other than remedying health state, unspecified: Secondary | ICD-10-CM | POA: Diagnosis not present

## 2023-02-02 DIAGNOSIS — Z113 Encounter for screening for infections with a predominantly sexual mode of transmission: Secondary | ICD-10-CM | POA: Diagnosis not present

## 2023-02-02 DIAGNOSIS — Z3202 Encounter for pregnancy test, result negative: Secondary | ICD-10-CM | POA: Diagnosis not present

## 2023-02-02 DIAGNOSIS — Z0000 Encounter for general adult medical examination without abnormal findings: Secondary | ICD-10-CM | POA: Diagnosis not present

## 2023-02-06 ENCOUNTER — Telehealth (INDEPENDENT_AMBULATORY_CARE_PROVIDER_SITE_OTHER): Payer: Medicaid Other | Admitting: Psychiatry

## 2023-02-06 ENCOUNTER — Encounter (HOSPITAL_COMMUNITY): Payer: Self-pay | Admitting: Psychiatry

## 2023-02-06 DIAGNOSIS — F330 Major depressive disorder, recurrent, mild: Secondary | ICD-10-CM | POA: Diagnosis not present

## 2023-02-06 DIAGNOSIS — F411 Generalized anxiety disorder: Secondary | ICD-10-CM

## 2023-02-06 DIAGNOSIS — F4310 Post-traumatic stress disorder, unspecified: Secondary | ICD-10-CM | POA: Diagnosis not present

## 2023-02-06 MED ORDER — TRAZODONE HCL 150 MG PO TABS: ORAL_TABLET | ORAL | 3 refills | Status: DC

## 2023-02-06 MED ORDER — PRAZOSIN HCL 2 MG PO CAPS: 2.0000 mg | ORAL_CAPSULE | Freq: Every day | ORAL | 3 refills | Status: DC

## 2023-02-06 MED ORDER — HYDROXYZINE HCL 25 MG PO TABS: 25.0000 mg | ORAL_TABLET | Freq: Three times a day (TID) | ORAL | 3 refills | Status: DC | PRN

## 2023-02-06 MED ORDER — VORTIOXETINE HBR 10 MG PO TABS: 10.0000 mg | ORAL_TABLET | Freq: Every day | ORAL | 3 refills | Status: DC

## 2023-02-06 NOTE — Progress Notes (Signed)
[place] MD/PA/NP OP Progress Note Virtual Visit via Video Note  I connected with [place] on 02/06/23 at  9:30 AM EST by a video enabled telemedicine application and verified that I am speaking with the correct person using two identifiers.  Location: Patient: Home Provider: Clinic   I discussed the limitations of evaluation and management by telemedicine and the availability of in person appointments. The patient expressed understanding and agreed to proceed.  I provided 30 minutes of non-face-to-face time during this encounter.   02/06/2023 9:27 AM Nancy Mendoza  MRN:  [id number]  Chief Complaint: "I stopped Zoloft due to sexual side effects"  HPI: 32 year old female seen today for follow up psychiatric evaluation. She has a psychiatric history of anxiety, depression, and PTSD. Currently she managed on trazodone 150 mg nightly as needed, Zoloft 25 mg daily, prazosin 2 mg nightly, Unisom 25 mg nightly, and hydroxyzine 25 mg 3 times daily.  She notes that she has not taken Zoloft in a week.  Today patient was well groomed, pleasant, cooperative, engaged in conversation and maintained eye contact.  She informed Probation officer that she stopped Zoloft because of sexual side effects.  She informed Probation officer that she was told by her PCP that Zoloft could be reducing her sex drive and she notes that it was.  Since discontinuing Zoloft patient notes that she has been more depressed and anxious.  Patient also notes that her family members believe that she is irritable.  Today provider conducted a GAD-7 and patient scored a 13, at her last visit she scored a 7.  She notes that she worries about her job, her children, and her partner. Provider also conducted a PHQ-9 patient scored an 11, at her last visit she scored a 3.  She endorses adequate sleep and appetite.  Today she denies SI/HI/VAH, mania, paranoia.   At this time Zoloft not restarted due to sexual side effects.  Patient informed that trazodone  can also cause sexual side effects she endorsed understanding.  Patient instructed to take trazodone as needed.  She will start Trintellix 10 mg daily to help manage anxiety and depression.  Patient will continue on Unisom 25 mg nightly as needed, hydroxyzine 25 mg 3 times daily as needed, and prazosin 2 mg nightly.  She will follow-up with outpatient counseling for therapy.  No other concerns at this time.      Visit Diagnosis:  No diagnosis found.   Past Psychiatric History: Anxiety and PTSD  Past Medical History:  Past Medical History:  Diagnosis Date   Anemia    History of postpartum hemorrhage     Past Surgical History:  Procedure Laterality Date   CESAREAN SECTION     CESAREAN SECTION N/A 02/19/2021   Procedure: REPEAT CESAREAN SECTION;  Surgeon: Bobbye Charleston, MD;  Location: [facility] LD [facility];  Service: Obstetrics;  Laterality: N/A;   DILATION AND CURETTAGE OF UTERUS      Family Psychiatric History: Two children (son autism)  Family History:  Family History  Problem Relation Age of Onset   COPD Mother    Hypertension Mother    Healthy Father    Sleep apnea Father    Hypertension Father     Social History:  Social History   Socioeconomic History   Marital status: Divorced    Spouse name: Not on file   Number of children: Not on file   Years of education: Not on file   Highest education level: Not on file  Occupational History   Not on file  Tobacco Use   Smoking status: Never   Smokeless tobacco: Never  Substance and Sexual Activity   Alcohol use: Never   Drug use: No   Sexual activity: Yes  Other Topics Concern   Not on file  Social History Narrative   Not on file   Social Determinants of Health   Financial Resource Strain: Not on file  Food Insecurity: Not on file  Transportation Needs: Not on file  Physical Activity: Not on file  Stress: Not on file  Social Connections: Not on file    Allergies:  Allergies  Allergen Reactions   Pollen Extract  Other (See Comments)    Metabolic Disorder Labs: No results found for: "HGBA1C", "MPG" No results found for: "PROLACTIN" No results found for: "CHOL", "TRIG", "HDL", "CHOLHDL", "VLDL", "LDLCALC" No results found for: "TSH"  Therapeutic Level Labs: No results found for: "LITHIUM" No results found for: "VALPROATE" No results found for: "CBMZ"  Current Medications: Current Outpatient Medications  Medication Sig Dispense Refill   albuterol (VENTOLIN HFA) 108 (90 Base) MCG/ACT inhaler ProAir HFA 90 mcg/actuation aerosol inhaler  INHALE 2 PUFFS BY MOUTH EVERY 4 HOURS AS NEEDED     diphenhydrAMINE HCl, Sleep, (UNISOM SLEEPMELTS) 25 MG TBDP Take 1 tablet (25 mg total) by mouth at bedtime as needed. 30 tablet 3   drospirenone-ethinyl estradiol (YAZ) 3-0.02 MG tablet Take 1 tablet by mouth daily.     fluticasone-salmeterol (ADVAIR HFA) 230-21 MCG/ACT inhaler Inhale 2 puffs into the lungs 2 (two) times daily. 1 each 12   hydrOXYzine (ATARAX) 25 MG tablet Take 1 tablet (25 mg total) by mouth 3 (three) times daily as needed. 90 tablet 3   prazosin (MINIPRESS) 2 MG capsule Take 1 capsule (2 mg total) by mouth at bedtime. 30 capsule 3   sertraline (ZOLOFT) 25 MG tablet Take 1 tablet (25 mg total) by mouth daily. 30 tablet 3   Spacer/Aero-Holding Chambers DEVI 2 puffs by Does not apply route as needed. 2 each 2   traZODone (DESYREL) 150 MG tablet TAKE 1 TABLET(150 MG) BY MOUTH AT BEDTIME 30 tablet 3   No current facility-administered medications for this visit.     Musculoskeletal: Strength & Muscle Tone: within normal limits and Teleheath visit,  Gait & Station:  Teleheath visit Patient leans: N/A  Psychiatric Specialty Exam: Review of Systems  unknown if currently breastfeeding.There is no height or weight on file to calculate BMI.  General Appearance: Well Groomed  Eye Contact:  Good  Speech:  Clear and Coherent and Normal Rate  Volume:  Normal  Mood:  Anxious and Depressed  Affect:   Appropriate and Congruent  Thought Process:  Coherent, Goal Directed, and Linear  Orientation:  Full (Time, Place, and Person)  Thought Content: WDL and Logical   Suicidal Thoughts:  No  Homicidal Thoughts:  No  Memory:  Immediate;   Good Recent;   Good Remote;   Good  Judgement:  Good  Insight:  Good  Psychomotor Activity:  Normal  Concentration:  Concentration: Good and Attention Span: Good  Recall:  Good  Fund of Knowledge: Good  Language: Good  Akathisia:  No  Handed:  Right  AIMS (if indicated): not done  Assets:  Communication Skills Desire for Improvement Financial Resources/Insurance Housing Physical Health Social Support Vocational/Educational  ADL's:  Intact  Cognition: WNL  Sleep:  Good   Screenings: GAD-7    Flowsheet Row Video Visit from 10/07/2022 in [place]  [facility] Video Visit from 06/30/2022 in [facility] Video Visit from 01/22/2022 in [facility] Office Visit from 10/25/2021 in [facility] Counselor from 08/09/2021 in [facility]  Total GAD-7 Score 7 19 18 14 21      $ PHQ2-9    Flowsheet Row Video Visit from 10/07/2022 in [facility] Video Visit from 06/30/2022 in [facility] Video Visit from 01/22/2022 in [facility] Office Visit from 10/25/2021 in [facility] Counselor from 08/09/2021 in [place]  PHQ-2 Total Score 0 4 4 3 2  $ PHQ-9 Total Score 3 18 19 11 17      $ [facility]21 in [facility] Admission (Discharged) from 02/19/2021 in [place] 4S Mother Baby Unit  C-SSRS RISK CATEGORY Error: Q7 should not be populated when Q6 is No No Risk No Risk         Assessment and Plan: Patient notes that since discontinuing Zoloft she has been more anxious and depressed.At this time Zoloft not restarted due to sexual side effects.  Patient informed that trazodone can also cause sexual side effects she endorsed understanding.  Patient instructed to take trazodone as needed.  She will start Trintellix 10 mg daily to help manage anxiety and depression.  Patient will continue on Unisom 25 mg nightly as needed, hydroxyzine 25 mg 3 times daily as needed, and prazosin 2 mg nightly.  1. Generalized anxiety disorder  Start- vortioxetine HBr (TRINTELLIX) 10 MG TABS tablet; Take 1 tablet (10 mg total) by mouth daily.  Dispense: 30 tablet; Refill: 3 Continue- hydrOXYzine (ATARAX) 25 MG tablet; Take 1 tablet (25 mg total) by mouth 3 (three) times daily as needed.  Dispense: 90 tablet; Refill: 3 Continue- traZODone (DESYREL) 150 MG tablet; TAKE 1 TABLET(150 MG) BY MOUTH AT BEDTIME  Dispense: 30 tablet; Refill: 3  2. PTSD (post-traumatic stress disorder)  Continue- vortioxetine HBr (TRINTELLIX) 10 MG TABS tablet; Take 1 tablet (10 mg total) by mouth daily.  Dispense: 30 tablet; Refill: 3 Continue- prazosin (MINIPRESS) 2 MG capsule; Take 1 capsule (2 mg total) by mouth at bedtime.  Dispense: 30 capsule; Refill: 3  3. MDD (major depressive disorder), recurrent episode, mild (HCC)  Stat- vortioxetine HBr (TRINTELLIX) 10 MG TABS tablet; Take 1 tablet (10 mg total) by mouth daily.  Dispense: 30 tablet; Refill: 3 Continue- traZODone (DESYREL) 150 MG tablet; TAKE 1 TABLET(150 MG) BY MOUTH AT BEDTIME  Dispense: 30 tablet; Refill: 3  Follow-up in 66-monthFollow-up for therapy  BSalley Slaughter NP 02/06/2023, 9:27 AM

## 2023-02-09 DIAGNOSIS — L700 Acne vulgaris: Secondary | ICD-10-CM | POA: Diagnosis not present

## 2023-02-09 DIAGNOSIS — L739 Follicular disorder, unspecified: Secondary | ICD-10-CM | POA: Diagnosis not present

## 2023-02-09 DIAGNOSIS — L918 Other hypertrophic disorders of the skin: Secondary | ICD-10-CM | POA: Diagnosis not present

## 2023-02-11 DIAGNOSIS — Z113 Encounter for screening for infections with a predominantly sexual mode of transmission: Secondary | ICD-10-CM | POA: Diagnosis not present

## 2023-02-11 DIAGNOSIS — N342 Other urethritis: Secondary | ICD-10-CM | POA: Diagnosis not present

## 2023-02-11 DIAGNOSIS — N760 Acute vaginitis: Secondary | ICD-10-CM | POA: Diagnosis not present

## 2023-02-11 DIAGNOSIS — R300 Dysuria: Secondary | ICD-10-CM | POA: Diagnosis not present

## 2023-02-27 DIAGNOSIS — Z419 Encounter for procedure for purposes other than remedying health state, unspecified: Secondary | ICD-10-CM | POA: Diagnosis not present

## 2023-03-05 ENCOUNTER — Ambulatory Visit (INDEPENDENT_AMBULATORY_CARE_PROVIDER_SITE_OTHER): Payer: Medicaid Other | Admitting: Clinical

## 2023-03-05 DIAGNOSIS — F330 Major depressive disorder, recurrent, mild: Secondary | ICD-10-CM | POA: Diagnosis not present

## 2023-03-05 NOTE — Progress Notes (Signed)
THERAPIST PROGRESS NOTE Virtual Visit via Video Note  I connected with [place] on 03/05/2023 at  1:00 PM EST by a video enabled telemedicine application and verified that I am speaking with the correct person using two identifiers.  Location: Patient: home Provider: office   I discussed the limitations of evaluation and management by telemedicine and the availability of in person appointments. The patient expressed understanding and agreed to proceed.   Follow Up Instructions: I discussed the assessment and treatment plan with the patient. The patient was provided an opportunity to ask questions and all were answered. The patient agreed with the plan and demonstrated an understanding of the instructions.   The patient was advised to call back or seek an in-person evaluation if the symptoms worsen or if the condition fails to improve as anticipated.    Session Time: 30 minutes  Participation Level: Active  Behavioral Response: CasualAlertEuthymic  Type of Therapy: Individual Therapy  Treatment Goals addressed: client will score less than a 5 on the GAD7 questionairee   ProgressTowards Goals: Progressing  Interventions: CBT and Supportive  Summary:  Day Greb is a 32 y.o. female who presents for the scheduled appointment oriented x 5, appropriately dressed, and friendly.  Client denied hallucinations or delusions. Client reported since she was last seen for therapy session she has had significant improvement with her symptoms.  Client reported she went to court and a new judge granted her a 50b extension which is good until December 2025.  Client reported that provide a lot relief for her.  Client reported over the years having to attend court regularly and see her ex-husband kept her from being in the mental space of healing and seeing her that she can move forward with life. Client reported she feels more at ease. 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 Client reported she feels better but only has 1 semester left for school. 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 Client reported she is looking to move out of third shift but it has been hard to do. Client reported medication management has bene going well as she describes her anxiety and depression symptoms minimal to none.  Evidence of progress towards goal:  client GAD and PHQ9 score has decreased to a 1.    03/06/2023    9:41 AM 02/06/2023    9:32 AM 10/07/2022    1:30 PM 06/30/2022    4:09 PM  GAD 7 : Generalized Anxiety Score  Nervous, Anxious, on Edge 0 2 1 3   Control/stop worrying 0 2 1 3   Worry too much - different things 0 2 1 3   Trouble relaxing 1 1 1 2   Restless 0 2 1 3   Easily annoyed or irritable 0 1 0 2  Afraid - awful might happen 0 3 2 3   Total GAD 7 Score 1 13 7 19   Anxiety Difficulty Not difficult at all Somewhat difficult Not difficult at all Very difficult   Flowsheet Row Counselor from 03/05/2023 in [facility]  PHQ-9 Total Score 1         Suicidal/Homicidal: Nowithout intent/plan  Therapist Response:  Therapist began the appointment asking the client how she has been doing since last seen. Therapist used CBT to engage using active listening and positive emotional support. Therapist used CBT to engage and ask  the client about medication compliance compared to ongoing symptoms. Therapist completed updated SDOH. Therapist used CBT to ask the client about changes to psychosocial stressors. Therapist used CBT to reinforce the client use of positive coping skills. Therapist ask the client to identify updaed tx plan goals. Therapist used CBT ask the client to identify her progress with frequency of use with coping skills with continued practice in her daily activity.    Therapist assigned the client homework to  continue with self care practices.    Plan: Return again in 4 weeks.  Diagnosis: MDD, recurrent episode, mild  Collaboration of Care: Other therapist messaged the clients psychiatrist about issue with obtaining her prescription.   Patient/Guardian was advised Release of Information must be obtained prior to any record release in order to collaborate their care with an outside provider. Patient/Guardian was advised if they have not already done so to contact the registration department to sign all necessary forms in order for [place] to release information regarding their care.   Consent: Patient/Guardian gives verbal consent for treatment and assignment of benefits for services provided during this visit. Patient/Guardian expressed understanding and agreed to proceed.   [place], LCSW 03/05/2023

## 2023-03-30 DIAGNOSIS — Z419 Encounter for procedure for purposes other than remedying health state, unspecified: Secondary | ICD-10-CM | POA: Diagnosis not present

## 2023-04-02 ENCOUNTER — Encounter (HOSPITAL_COMMUNITY): Payer: Self-pay

## 2023-04-02 ENCOUNTER — Ambulatory Visit (HOSPITAL_COMMUNITY): Payer: Medicaid Other | Admitting: Clinical

## 2023-04-23 ENCOUNTER — Ambulatory Visit (HOSPITAL_COMMUNITY): Payer: Medicaid Other | Admitting: Clinical

## 2023-04-23 ENCOUNTER — Encounter (HOSPITAL_COMMUNITY): Payer: Self-pay

## 2023-04-24 ENCOUNTER — Telehealth (INDEPENDENT_AMBULATORY_CARE_PROVIDER_SITE_OTHER): Payer: Medicaid Other | Admitting: Psychiatry

## 2023-04-24 ENCOUNTER — Encounter (HOSPITAL_COMMUNITY): Payer: Self-pay | Admitting: Psychiatry

## 2023-04-24 DIAGNOSIS — F411 Generalized anxiety disorder: Secondary | ICD-10-CM | POA: Diagnosis not present

## 2023-04-24 DIAGNOSIS — F330 Major depressive disorder, recurrent, mild: Secondary | ICD-10-CM

## 2023-04-24 DIAGNOSIS — F4310 Post-traumatic stress disorder, unspecified: Secondary | ICD-10-CM | POA: Diagnosis not present

## 2023-04-24 MED ORDER — HYDROXYZINE HCL 25 MG PO TABS: 25.0000 mg | ORAL_TABLET | Freq: Three times a day (TID) | ORAL | 3 refills | Status: DC | PRN

## 2023-04-24 MED ORDER — PRAZOSIN HCL 2 MG PO CAPS: 2.0000 mg | ORAL_CAPSULE | Freq: Every day | ORAL | 3 refills | Status: DC

## 2023-04-24 MED ORDER — BUPROPION HCL ER (XL) 150 MG PO TB24: 150.0000 mg | ORAL_TABLET | ORAL | 3 refills | Status: DC

## 2023-04-24 MED ORDER — TRAZODONE HCL 150 MG PO TABS: ORAL_TABLET | ORAL | 3 refills | Status: DC

## 2023-04-24 NOTE — Progress Notes (Signed)
BH MD/PA/NP OP Progress Note Virtual Visit via Video Note  I connected with Nancy Mendoza on 04/24/23 at 10:30 AM EDT by a video enabled telemedicine application and verified that I am speaking with the correct person using two identifiers.  Location: Patient: Home Provider: Clinic   I discussed the limitations of evaluation and management by telemedicine and the availability of in person appointments. The patient expressed understanding and agreed to proceed.  I provided 30 minutes of non-face-to-face time during this encounter.   04/24/2023 10:44 AM Nancy Mendoza  MRN:  [id number]  Chief Complaint: "I was not able to get the Trintellix"  HPI: 32 year old female seen today for follow up psychiatric evaluation. She has a psychiatric history of anxiety, depression, and PTSD. Currently she managed on trazodone 150 mg nightly as needed, Trintellix 10 mg daily mg daily, prazosin 2 mg nightly, Unisom 25 mg nightly, and hydroxyzine 25 mg 3 times daily.  She notes that she was not able to get Trintellix.  Today patient was well groomed, pleasant, cooperative, engaged in conversation and maintained eye contact.  She informed Clinical research associate that her insurance did not approve for Trintellix.  Patient notes that at times she continues to suffer from anxiety and depression.  She informed Clinical research associate that she has family stressors that are exacerbating her anxiety and depression.  She also informed Clinical research associate that she works third shift half of the week and notes that this is interfering with her mental health.  She notes that on May 5 she will begin working first shift primarily within the [facility] health system.  Today provider conducted a GAD-7 and patient scored a 14, at her last visit she scored a 13.  Provider also conducted PHQ-9 the patient scored an 8, at her last visit she scored a 1.  She endorses fluctuations in sleep.  She informed Clinical research associate that her appetite is adequate.  Today she denies SI/HI/AVH, mania,  paranoia.    Patient has trialed Zoloft however it has caused sexual side effects.  Provider attempted to place patient on Trintellix to help manage anxiety and depression but her insurance will not approve it.  At this time patient agreeable to starting Wellbutrin 150 mg to help manage symptoms of depression and reduce sexual side effects.  Provider offered increasing hydroxyzine however patient notes that anxiety may be situational.  She will continue all other medications as prescribed.  She will follow-up with outpatient counseling for therapy. no other concerns noted at this time.      Visit Diagnosis:    ICD-10-CM   1. Generalized anxiety disorder  F41.1 hydrOXYzine (ATARAX) 25 MG tablet    traZODone (DESYREL) 150 MG tablet    2. PTSD (post-traumatic stress disorder)  F43.10 buPROPion (WELLBUTRIN XL) 150 MG 24 hr tablet    prazosin (MINIPRESS) 2 MG capsule    3. MDD (major depressive disorder), recurrent episode, mild (HCC)  F33.0 buPROPion (WELLBUTRIN XL) 150 MG 24 hr tablet    traZODone (DESYREL) 150 MG tablet       Past Psychiatric History: Anxiety and PTSD  Past Medical History:  Past Medical History:  Diagnosis Date   Anemia    History of postpartum hemorrhage     Past Surgical History:  Procedure Laterality Date   CESAREAN SECTION     CESAREAN SECTION N/A 02/19/2021   Procedure: REPEAT CESAREAN SECTION;  Surgeon: Carrington Clamp, MD;  Location: [facility] LD [facility];  Service: Obstetrics;  Laterality: N/A;   DILATION AND CURETTAGE  OF UTERUS      Family Psychiatric History: Two children (son autism)  Family History:  Family History  Problem Relation Age of Onset   COPD Mother    Hypertension Mother    Healthy Father    Sleep apnea Father    Hypertension Father     Social History:  Social History   Socioeconomic History   Marital status: Divorced    Spouse name: Not on file   Number of children: Not on file   Years of education: Not on file   Highest education  level: Not on file  Occupational History   Not on file  Tobacco Use   Smoking status: Never   Smokeless tobacco: Never  Substance and Sexual Activity   Alcohol use: Never   Drug use: No   Sexual activity: Yes  Other Topics Concern   Not on file  Social History Narrative   Not on file   Social Determinants of Health   Financial Resource Strain: Not on file  Food Insecurity: Not on file  Transportation Needs: Not on file  Physical Activity: Not on file  Stress: Not on file  Social Connections: Not on file    Allergies:  Allergies  Allergen Reactions   Pollen Extract Other (See Comments)    Metabolic Disorder Labs: No results found for: "HGBA1C", "MPG" No results found for: "PROLACTIN" No results found for: "CHOL", "TRIG", "HDL", "CHOLHDL", "VLDL", "LDLCALC" No results found for: "TSH"  Therapeutic Level Labs: No results found for: "LITHIUM" No results found for: "VALPROATE" No results found for: "CBMZ"  Current Medications: Current Outpatient Medications  Medication Sig Dispense Refill   buPROPion (WELLBUTRIN XL) 150 MG 24 hr tablet Take 1 tablet (150 mg total) by mouth every morning. 30 tablet 3   albuterol (VENTOLIN HFA) 108 (90 Base) MCG/ACT inhaler ProAir HFA 90 mcg/actuation aerosol inhaler  INHALE 2 PUFFS BY MOUTH EVERY 4 HOURS AS NEEDED     diphenhydrAMINE HCl, Sleep, (UNISOM SLEEPMELTS) 25 MG TBDP Take 1 tablet (25 mg total) by mouth at bedtime as needed. 30 tablet 3   drospirenone-ethinyl estradiol (YAZ) 3-0.02 MG tablet Take 1 tablet by mouth daily.     fluticasone-salmeterol (ADVAIR HFA) 230-21 MCG/ACT inhaler Inhale 2 puffs into the lungs 2 (two) times daily. 1 each 12   hydrOXYzine (ATARAX) 25 MG tablet Take 1 tablet (25 mg total) by mouth 3 (three) times daily as needed. 90 tablet 3   prazosin (MINIPRESS) 2 MG capsule Take 1 capsule (2 mg total) by mouth at bedtime. 30 capsule 3   Spacer/Aero-Holding Chambers DEVI 2 puffs by Does not apply route as  needed. 2 each 2   traZODone (DESYREL) 150 MG tablet TAKE 1 TABLET(150 MG) BY MOUTH AT BEDTIME 30 tablet 3   No current facility-administered medications for this visit.     Musculoskeletal: Strength & Muscle Tone: within normal limits and Teleheath visit,  Gait & Station:  Teleheath visit Patient leans: N/A  Psychiatric Specialty Exam: Review of Systems  unknown if currently breastfeeding.There is no height or weight on file to calculate BMI.  General Appearance: Well Groomed  Eye Contact:  Good  Speech:  Clear and Coherent and Normal Rate  Volume:  Normal  Mood:  Anxious and Depressed  Affect:  Appropriate and Congruent  Thought Process:  Coherent, Goal Directed, and Linear  Orientation:  Full (Time, Place, and Person)  Thought Content: WDL and Logical   Suicidal Thoughts:  No  Homicidal Thoughts:  No  Memory:  Immediate;   Good Recent;   Good Remote;   Good  Judgement:  Good  Insight:  Good  Psychomotor Activity:  Normal  Concentration:  Concentration: Good and Attention Span: Good  Recall:  Good  Fund of Knowledge: Good  Language: Good  Akathisia:  No  Handed:  Right  AIMS (if indicated): not done  Assets:  Communication Skills Desire for Improvement Financial Resources/Insurance Housing Physical Health Social Support Vocational/Educational  ADL's:  Intact  Cognition: WNL  Sleep:  Fair   Screenings: GAD-7    Flowsheet Row Video Visit from 04/24/2023 in [facility] from 03/05/2023 in [facility] Video Visit from 02/06/2023 in [facility] Video Visit from 10/07/2022 in [facility] Video Visit from 06/30/2022 in [facility]  Total GAD-7 Score 14 1 13 7 19       PHQ2-9    Flowsheet Row Video Visit from 04/24/2023 in [facility] Counselor from 03/05/2023 in [facility] Video Visit from 02/06/2023 in [facility] Video Visit from 10/07/2022 in [facility] Video Visit from 06/30/2022 in [facility]  PHQ-2 Total Score 2 0 2 0 4  PHQ-9 Total Score 8 1 11 3 18       Flowsheet Row Video Visit from 01/22/2022 in [facility] Counselor from 08/09/2021 in [facility] Ad[facility] (Discharged) from 02/19/2021 in [facility]  C-SSRS RISK CATEGORY Error: Q7 should not be populated when Q6 is No No Risk No Risk        Assessment and Plan: Patient endorses symptoms of mild depression, anxiety, and fluctuations in sleep. Patient has trialed Zoloft however it has caused sexual side effects.  Provider attempted to place patient on Trintellix to help manage anxiety and depression but her insurance will not approve it.  At this time patient agreeable to starting Wellbutrin 150 mg to help manage symptoms of depression and reduce sexual side effects.  Provider offered increasing hydroxyzine however patient notes that anxiety may be situational.  She will continue all other medications as prescribed.    1. Generalized anxiety disorder  Continue- hydrOXYzine (ATARAX) 25 MG tablet; Take 1 tablet (25 mg total) by mouth 3 (three) times daily as needed.  Dispense: 90 tablet; Refill: 3 Continue- traZODone (DESYREL) 150 MG tablet; TAKE 1 TABLET(150 MG) BY MOUTH AT BEDTIME  Dispense: 30 tablet; Refill: 3  2. PTSD (post-traumatic stress disorder)  Continue- prazosin (MINIPRESS) 2 MG capsule; Take 1 capsule (2 mg total) by mouth at bedtime.  Dispense: 30 capsule; Refill: 3  3. MDD (major depressive disorder), recurrent episode, mild (HCC)  Start- buPROPion (WELLBUTRIN XL) 150 MG 24 hr tablet; Take 1 tablet (150 mg total) by mouth every morning.  Dispense: 30 tablet; Refill: 3 Continue- traZODone (DESYREL) 150 MG tablet; TAKE 1  TABLET(150 MG) BY MOUTH AT BEDTIME  Dispense: 30 tablet; Refill: 3    Follow-up in 76-month Follow-up for therapy  Shanna Cisco, NP 04/24/2023, 10:44 AM

## 2023-04-28 ENCOUNTER — Telehealth (HOSPITAL_COMMUNITY): Payer: Self-pay | Admitting: Clinical

## 2023-04-28 NOTE — Telephone Encounter (Signed)
Therapist made contact with the client via scheduled appt time on mychart video. Client reported she is doing well. Client reported she is excited that she will be completing school and has found a job on the day shift. Client reported she is doing well with no concerns. Client and therapist did not meet long enough for a billable. The encounter is being documented as a telephone call.

## 2023-04-29 ENCOUNTER — Telehealth: Payer: Self-pay

## 2023-04-29 DIAGNOSIS — Z419 Encounter for procedure for purposes other than remedying health state, unspecified: Secondary | ICD-10-CM | POA: Diagnosis not present

## 2023-04-29 NOTE — Telephone Encounter (Signed)
Sending mychart msg to patient. AS, CMA 

## 2023-05-12 DIAGNOSIS — L509 Urticaria, unspecified: Secondary | ICD-10-CM | POA: Diagnosis not present

## 2023-05-12 DIAGNOSIS — L700 Acne vulgaris: Secondary | ICD-10-CM | POA: Diagnosis not present

## 2023-05-12 DIAGNOSIS — L810 Postinflammatory hyperpigmentation: Secondary | ICD-10-CM | POA: Diagnosis not present

## 2023-06-09 ENCOUNTER — Ambulatory Visit: Payer: Medicaid Other | Admitting: Pulmonary Disease

## 2023-06-11 ENCOUNTER — Ambulatory Visit (INDEPENDENT_AMBULATORY_CARE_PROVIDER_SITE_OTHER): Payer: Medicaid Other | Admitting: Clinical

## 2023-06-11 ENCOUNTER — Other Ambulatory Visit (HOSPITAL_COMMUNITY): Payer: Self-pay | Admitting: Psychiatry

## 2023-06-11 DIAGNOSIS — F411 Generalized anxiety disorder: Secondary | ICD-10-CM

## 2023-06-11 DIAGNOSIS — F330 Major depressive disorder, recurrent, mild: Secondary | ICD-10-CM

## 2023-06-11 DIAGNOSIS — F4310 Post-traumatic stress disorder, unspecified: Secondary | ICD-10-CM | POA: Diagnosis not present

## 2023-06-11 MED ORDER — HYDROXYZINE HCL 25 MG PO TABS
25.0000 mg | ORAL_TABLET | Freq: Three times a day (TID) | ORAL | 3 refills | Status: AC | PRN
Start: 2023-06-11 — End: ?

## 2023-06-11 MED ORDER — TRAZODONE HCL 150 MG PO TABS
ORAL_TABLET | ORAL | 3 refills | Status: AC
Start: 2023-06-11 — End: ?

## 2023-06-11 MED ORDER — UNISOM SLEEPMELTS 25 MG PO TBDP
25.0000 mg | ORAL_TABLET | Freq: Every evening | ORAL | 3 refills | Status: AC | PRN
Start: 2023-06-11 — End: ?

## 2023-06-11 MED ORDER — PRAZOSIN HCL 2 MG PO CAPS
2.0000 mg | ORAL_CAPSULE | Freq: Every day | ORAL | 3 refills | Status: AC
Start: 2023-06-11 — End: ?

## 2023-06-11 MED ORDER — BUPROPION HCL ER (XL) 150 MG PO TB24
150.0000 mg | ORAL_TABLET | ORAL | 3 refills | Status: AC
Start: 2023-06-11 — End: ?
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
## 2023-06-11 NOTE — Telephone Encounter (Signed)
Patient spoke to her counselor today and requested that writer call her.  She informed Clinical research associate that she will be moving out of the state and has lost her insurance.  She informed Clinical research associate that she is in need of refills of her medication.  She reports that she finds Wellbutrin and Unisom most effective.  Today all medications refilled and sent to her preferred pharmacy.

## 2023-06-12 ENCOUNTER — Encounter: Payer: Self-pay | Admitting: Pulmonary Disease

## 2023-06-12 ENCOUNTER — Ambulatory Visit (INDEPENDENT_AMBULATORY_CARE_PROVIDER_SITE_OTHER): Payer: Medicaid Other | Admitting: Pulmonary Disease

## 2023-06-12 VITALS — BP 120/78 | HR 92 | Temp 98.3°F | Ht 64.0 in | Wt 195.8 lb

## 2023-06-12 DIAGNOSIS — J4540 Moderate persistent asthma, uncomplicated: Secondary | ICD-10-CM

## 2023-06-12 MED ORDER — ALBUTEROL SULFATE HFA 108 (90 BASE) MCG/ACT IN AERS: INHALATION_SPRAY | RESPIRATORY_TRACT | 11 refills | Status: DC

## 2023-06-12 MED ORDER — ALBUTEROL SULFATE HFA 108 (90 BASE) MCG/ACT IN AERS: INHALATION_SPRAY | RESPIRATORY_TRACT | 11 refills | Status: AC

## 2023-06-12 MED ORDER — FLUTICASONE-SALMETEROL 230-21 MCG/ACT IN AERO: 2.0000 | INHALATION_SPRAY | Freq: Two times a day (BID) | RESPIRATORY_TRACT | 12 refills | Status: AC

## 2023-06-12 NOTE — Patient Instructions (Signed)
Nice to see you again  I went ahead and sent refills for 1 year worth of the Advair and albuterol  Good luck with the move!  Return to clinic as needed

## 2023-06-12 NOTE — Progress Notes (Signed)
@Patient  ID: Nancy Mendoza, female    DOB: May 28, 1991, 32 y.o.   MRN: [id number]  Chief complaint: dyspnea   Referring provider: Marva Panda, NP  HPI:   32 y.o. with history asthma whom we have seen in follow up for evaluation of dyspnea on exertion.    She is doing okay.  Using Advair sparingly.  Symptoms largely okay.  When she gets into worsening symptoms she is using albuterol more.  Finds that this is more effective than the Advair.  Reviewed role and rationale for maintenance versus rescue inhaler today.  HPI at initial visit: Diagnosed with asthma as a child.  Got better.  Over the years.  Worsening dyspnea over the last 6 to 8 months.  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  For this not really any issues with breathing.  Use albuterol prior to work without noticeable improvement in breathing.  No other time of day when things are better or worse.  No position make things better or worse.  Does have seasonal allergies but unsure if seasonal changes are making things better or worse.  No other alleviating or exacerbating factors.  Never used maintenance inhaler.  No recent chest imaging.  Review of labs indicates history of anemia, last CBC 01/2021.  History of fluctuant anemia in the past but largely centers around times of her cesarean section, birth of children.  PMH: Asthma, seasonal allergies Surgical history: C-section x3 Family history: COPD hypertension in mother, hypertension sleep apnea father Social history: Never smoker, lives in [place], works at [employer] / Pulmonary Flowsheets:   ACT:  Asthma Control Test ACT Total Score  06/12/2023  2:12 PM 18  06/11/2022  3:14 PM 21  04/16/2022  2:03 PM 9    MMRC:     No data to display           Epworth:      No data to display           Tests:   FENO:  No results found for: "NITRICOXIDE"  PFT:    Latest Ref Rng &  Units 05/29/2022   11:56 AM  PFT Results  FVC-Pre L 3.24   FVC-Predicted Pre % 122   FVC-Post L 3.68   FVC-Predicted Post % 139   Pre FEV1/FVC % % 54   Post FEV1/FCV % % 86   FEV1-Pre L 1.74   FEV1-Predicted Pre % 75   FEV1-Post L 3.19   DLCO uncorrected ml/min/mmHg 26.49   DLCO UNC% % 170   DLCO corrected ml/min/mmHg 26.40   DLCO COR %Predicted % 170   DLVA Predicted % 121   TLC L 4.17   TLC % Predicted % 117   RV % Predicted % 78   Personally reviewed and interpreted as significant bronchodilator response  WALK:      No data to display           Imaging:   Lab Results: Personally reviewed CBC    Component Value Date/Time   WBC 11.1 (H) 04/16/2022 1446   RBC 4.76 04/16/2022 1446   HGB 13.5 04/16/2022 1446   HCT 41.2 04/16/2022 1446   PLT 263.0 04/16/2022 1446   MCV 86.6 04/16/2022 1446   MCH 26.2 02/20/2021 0452   MCHC 32.9 04/16/2022 1446   RDW 13.6 04/16/2022 1446   LYMPHSABS 2.5 04/16/2022 1446   MONOABS 0.7 04/16/2022 1446   EOSABS 0.1  04/16/2022 1446   BASOSABS 0.1 04/16/2022 1446    BMET    Component Value Date/Time   NA 139 08/28/2018 0351   K 4.1 08/28/2018 0351   CL 104 08/28/2018 0351   CO2 23 08/28/2018 0351   GLUCOSE 86 08/28/2018 0351   BUN 13 08/28/2018 0351   CREATININE 0.72 08/28/2018 0351   CALCIUM 8.5 (L) 08/28/2018 0351   GFRNONAA >60 08/28/2018 0351   GFRAA >60 08/28/2018 0351    BNP No results found for: "BNP"  ProBNP No results found for: "PROBNP"  Specialty Problems   None   Allergies  Allergen Reactions   Pollen Extract Other (See Comments)    Immunization History  Administered Date(s) Administered   Hepatitis B, PED/ADOLESCENT 1991-06-22, 02/27/1992, 07/10/1992   Influenza,inj,Quad PF,6+ Mos 02/26/2019   Influenza,inj,quad, With Preservative 10/09/2016, 09/22/2020   Influenza-Unspecified 09/22/2020, 09/20/2021, 10/03/2022   MMR 03/11/1993   PFIZER(Purple Top)SARS-COV-2 Vaccination 01/07/2020,  01/28/2020, 10/26/2020   Pfizer Covid-19 Vaccine Bivalent Booster 53yrs & up 12/26/2021   Tdap 12/14/2020    Past Medical History:  Diagnosis Date   Anemia    History of postpartum hemorrhage     Tobacco History: Social History   Tobacco Use  Smoking Status Never  Smokeless Tobacco Never   Counseling given: Not Answered   Continue to not smoke  Outpatient Encounter Medications as of 06/12/2023  Medication Sig   buPROPion (WELLBUTRIN XL) 150 MG 24 hr tablet Take 1 tablet (150 mg total) by mouth every morning.   diphenhydrAMINE HCl, Sleep, (UNISOM SLEEPMELTS) 25 MG TBDP Take 1 tablet (25 mg total) by mouth at bedtime as needed.   famotidine (PEPCID) 20 MG tablet Take by mouth.   hydrOXYzine (ATARAX) 25 MG tablet Take 1 tablet (25 mg total) by mouth 3 (three) times daily as needed.   prazosin (MINIPRESS) 2 MG capsule Take 1 capsule (2 mg total) by mouth at bedtime.   sertraline (ZOLOFT) 25 MG tablet Take 25 mg by mouth daily.   Spacer/Aero-Holding Chambers DEVI 2 puffs by Does not apply route as needed.   traZODone (DESYREL) 150 MG tablet TAKE 1 TABLET(150 MG) BY MOUTH AT BEDTIME   [DISCONTINUED] albuterol (VENTOLIN HFA) 108 (90 Base) MCG/ACT inhaler ProAir HFA 90 mcg/actuation aerosol inhaler  INHALE 2 PUFFS BY MOUTH EVERY 4 HOURS AS NEEDED   [DISCONTINUED] drospirenone-ethinyl estradiol (YAZ) 3-0.02 MG tablet Take 1 tablet by mouth daily.   [DISCONTINUED] fluticasone-salmeterol (ADVAIR HFA) 230-21 MCG/ACT inhaler Inhale 2 puffs into the lungs 2 (two) times daily.   albuterol (VENTOLIN HFA) 108 (90 Base) MCG/ACT inhaler ProAir HFA 90 mcg/actuation aerosol inhaler  INHALE 2 PUFFS BY MOUTH EVERY 4 HOURS AS NEEDED   fluticasone-salmeterol (ADVAIR HFA) 230-21 MCG/ACT inhaler Inhale 2 puffs into the lungs 2 (two) times daily.   [DISCONTINUED] albuterol (VENTOLIN HFA) 108 (90 Base) MCG/ACT inhaler ProAir HFA 90 mcg/actuation aerosol inhaler  INHALE 2 PUFFS BY MOUTH EVERY 4 HOURS AS  NEEDED   [DISCONTINUED] buPROPion (WELLBUTRIN XL) 150 MG 24 hr tablet Take 1 tablet (150 mg total) by mouth every morning.   [DISCONTINUED] diphenhydrAMINE HCl, Sleep, (UNISOM SLEEPMELTS) 25 MG TBDP Take 1 tablet (25 mg total) by mouth at bedtime as needed.   [DISCONTINUED] hydrOXYzine (ATARAX) 25 MG tablet Take 1 tablet (25 mg total) by mouth 3 (three) times daily as needed.   [DISCONTINUED] prazosin (MINIPRESS) 2 MG capsule Take 1 capsule (2 mg total) by mouth at bedtime.   [DISCONTINUED] traZODone (DESYREL) 150 MG tablet TAKE  1 TABLET(150 MG) BY MOUTH AT BEDTIME   No facility-administered encounter medications on file as of 06/12/2023.     Review of Systems  Review of Systems  N/a Physical Exam  BP 120/78 (BP Location: Left Arm, Patient Position: Sitting, Cuff Size: Normal)   Pulse 92   Temp 98.3 F (36.8 C) (Temporal)   Ht 5\' 4"  (1.626 m)   Wt 195 lb 12.8 oz (88.8 kg)   SpO2 93%   BMI 33.61 kg/m   Wt Readings from Last 5 Encounters:  06/12/23 195 lb 12.8 oz (88.8 kg)  09/17/22 197 lb (89.4 kg)  06/11/22 197 lb 9.6 oz (89.6 kg)  04/16/22 190 lb (86.2 kg)  02/19/21 190 lb (86.2 kg)    BMI Readings from Last 5 Encounters:  06/12/23 33.61 kg/m  09/17/22 33.81 kg/m  06/11/22 33.92 kg/m  04/16/22 32.61 kg/m  02/19/21 32.61 kg/m     Physical Exam General: Well-appearing, no acute distress Eyes: EOMI, icterus Neck: Supple no JVP Pulmonary: Clear, normal work of breathing. Cardiovascular: Regular rate and rhythm, no murmur Abdomen: Nontender, soft present MSK: No synovitis, no effusion Neuro: Normal gait, no weakness Psych: Normal full affect  Assessment & Plan:   Dyspnea on exertion: Likely multifactorial related to deconditioning, high concern for uncontrolled asthma.  Worsened with new work environment.  PFTs consistent with asthma.  Symptoms improved with Symbicort.  Switch to Advair 08/2022 given throat discomfort with Symbicort.  Symptoms not as good,  using albuterol.  Reviewed maintenance versus rescue inhalers.  Asthma: Moderate persistent.  No improvement with albuterol.  Childhood diagnosis.  Worsening dyspnea after changing jobs, new work environment.  Switch to Advair HFA given throat irritation with Symbicort. Symptoms not as good, using albuterol.  Reviewed maintenance versus rescue inhalers.  Return if symptoms worsen or fail to improve.   Karren Burly, MD 06/12/2023

## 2023-06-19 ENCOUNTER — Ambulatory Visit (HOSPITAL_COMMUNITY): Payer: Medicaid Other | Admitting: Clinical

## 2023-07-03 ENCOUNTER — Telehealth (HOSPITAL_COMMUNITY): Payer: Medicaid Other | Admitting: Psychiatry
